# Patient Record
Sex: Female | Born: 1980 | Race: Asian | Hispanic: No | Marital: Married | Smoking: Never smoker
Health system: Southern US, Community
[De-identification: ages and names within clinical notes are randomized; demographics above are authoritative.]

## PROBLEM LIST (undated history)

## (undated) DIAGNOSIS — Z789 Other specified health status: Secondary | ICD-10-CM

## (undated) HISTORY — PX: NO PAST SURGERIES: SHX2092

---

## 2005-02-11 ENCOUNTER — Inpatient Hospital Stay: Payer: Self-pay | Admitting: Internal Medicine

## 2005-02-11 ENCOUNTER — Other Ambulatory Visit: Payer: Self-pay

## 2007-03-22 IMAGING — CT CT HEAD WITHOUT CONTRAST
2 series · 16 of 30 positions shown, 20 images · non-contrast
Comparison: none

REASON FOR EXAM: altered mental status  rm 18
COMMENTS:

[Series 2: without · axial · non-contrast · 0.37mm/px · z∈[-192,-72]mm · 13 of 29 slices shown, 17 images]
[im 3/29  brain]
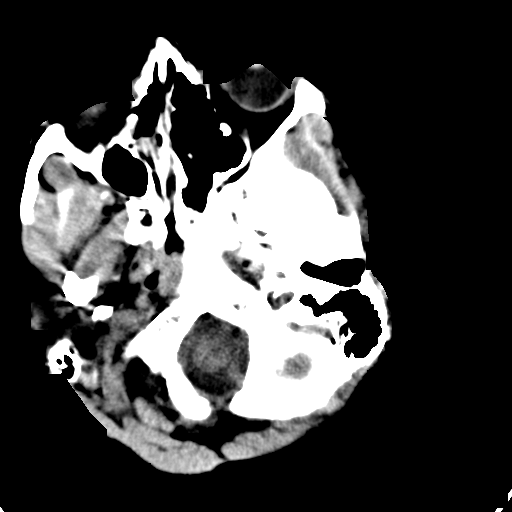
[im 3/29  bone]
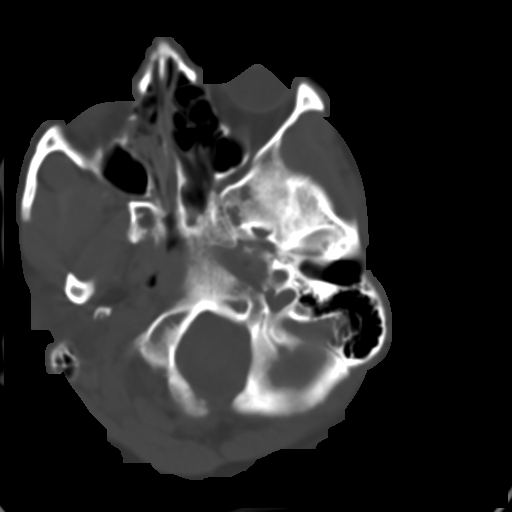
[im 5/29  brain]
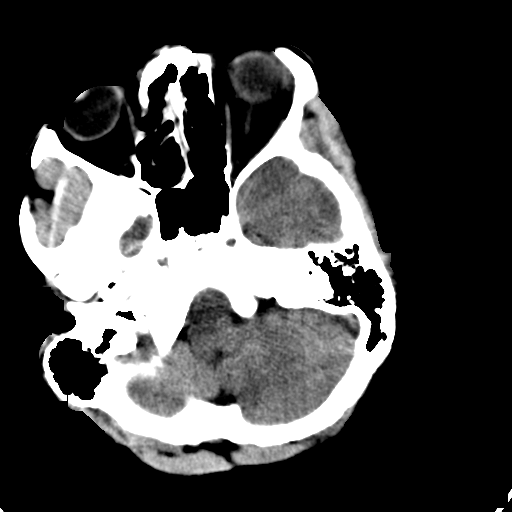
[im 7/29  brain]
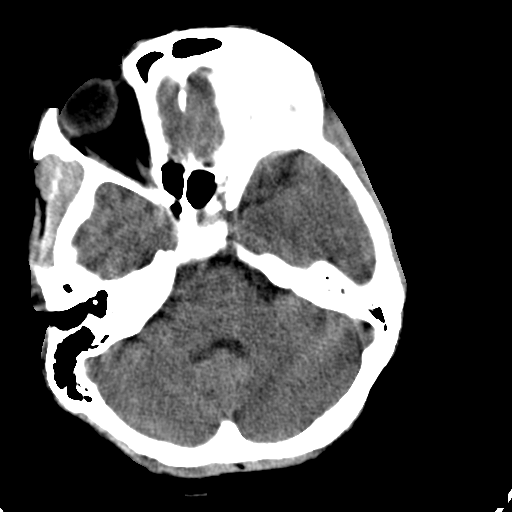
[im 9/29  brain]
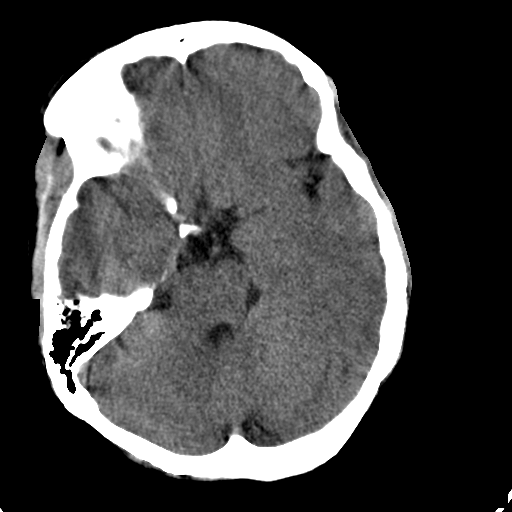
[im 11/29  brain]
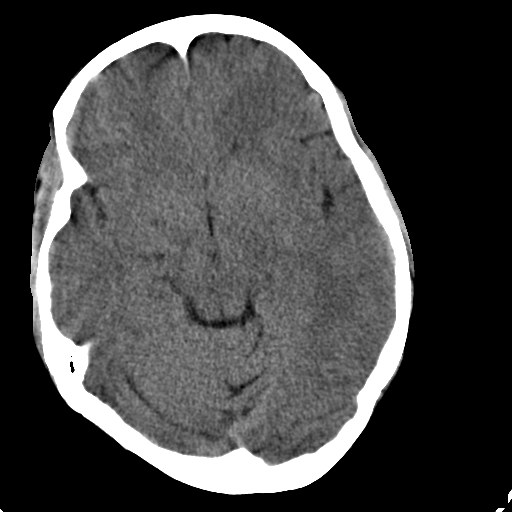
[im 11/29  bone]
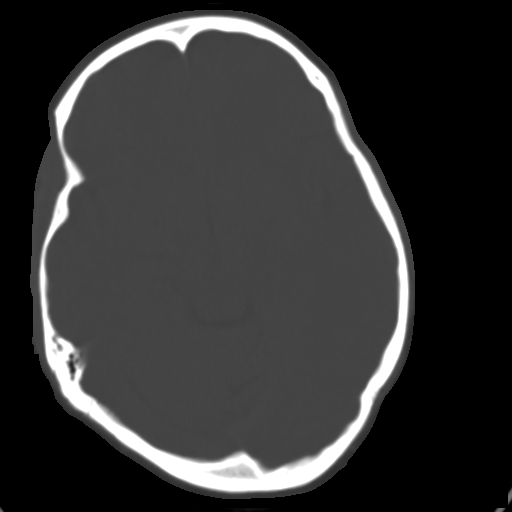
[im 13/29  brain]
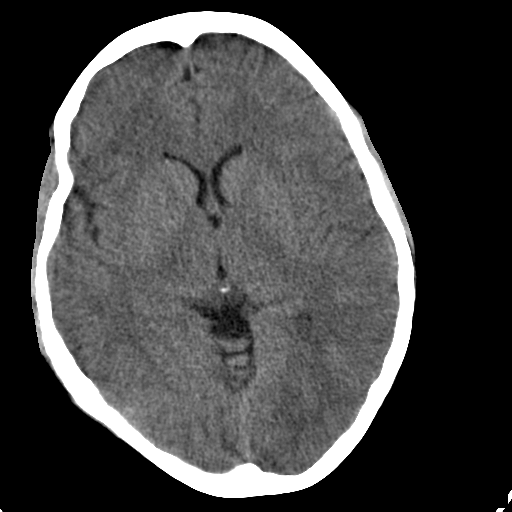
[im 15/29  brain]
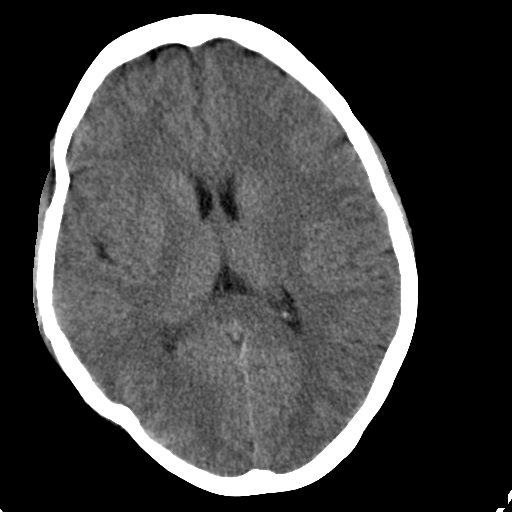
[im 17/29  brain]
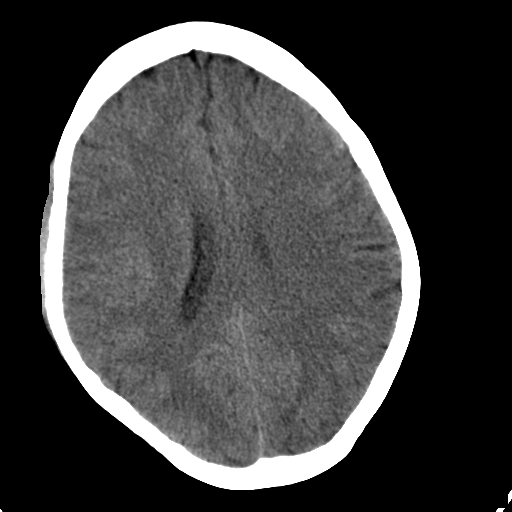
[im 19/29  brain]
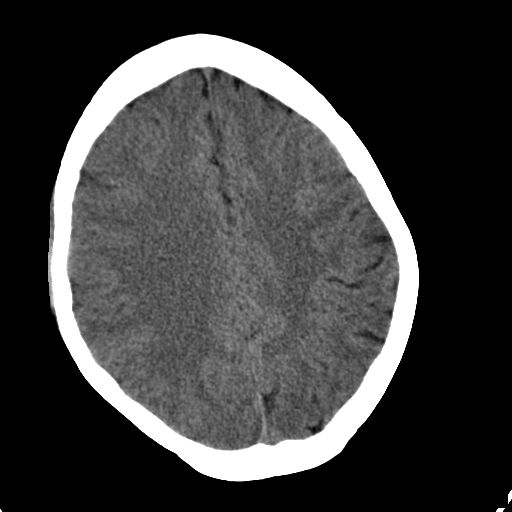
[im 19/29  bone]
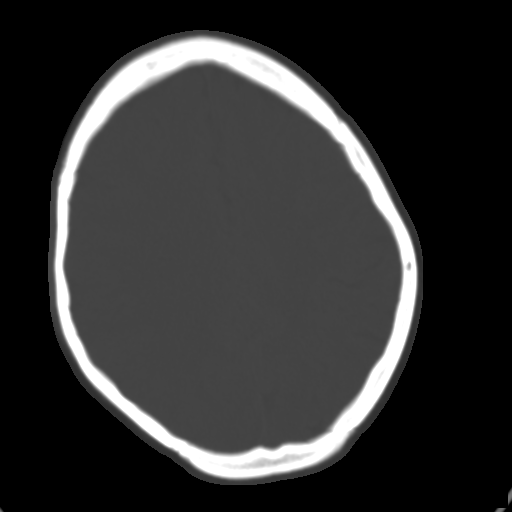
[im 21/29  brain]
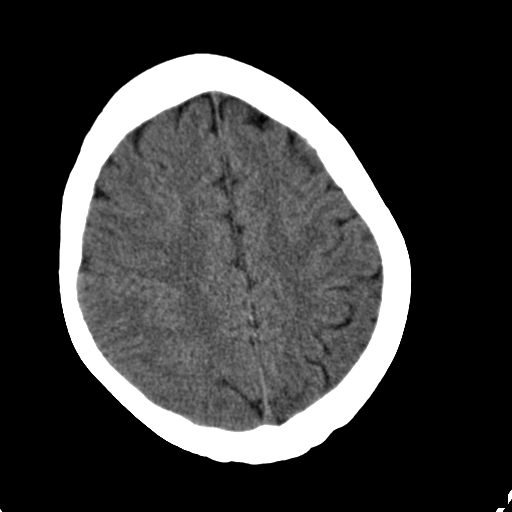
[im 23/29  brain]
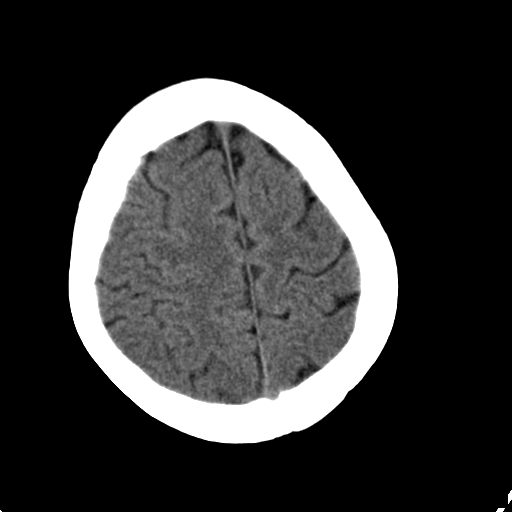
[im 25/29  brain]
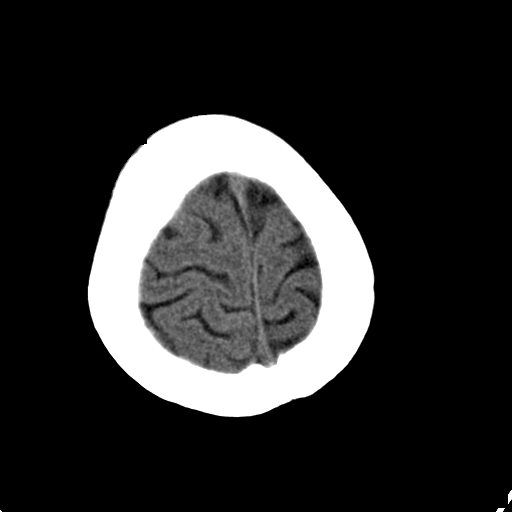
[im 27/29  brain]
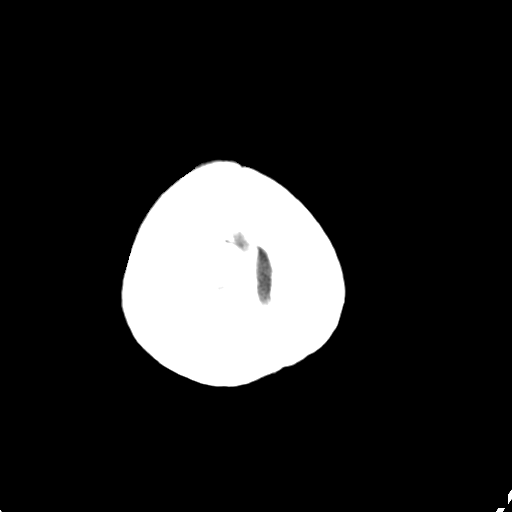
[im 27/29  bone]
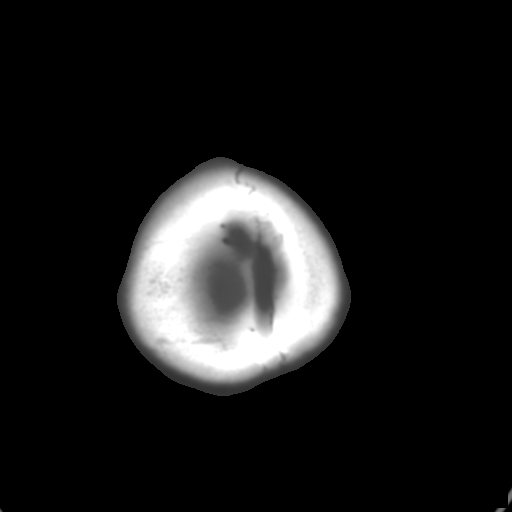

[Series 3: bone · axial · 0.37mm/px · z∈[-192,-152]mm · 3 of 29 slices shown]
[im 3/29  bone]
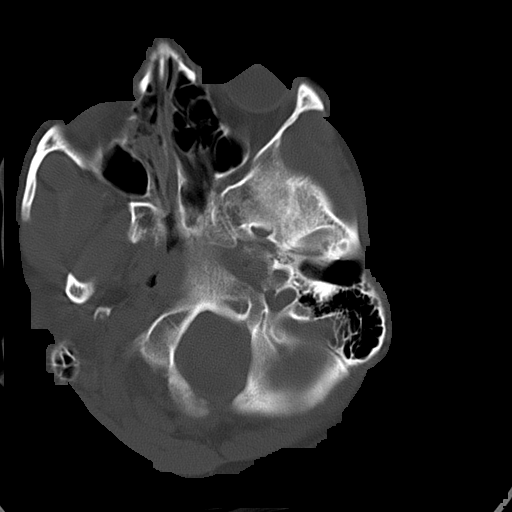
[im 7/29  bone]
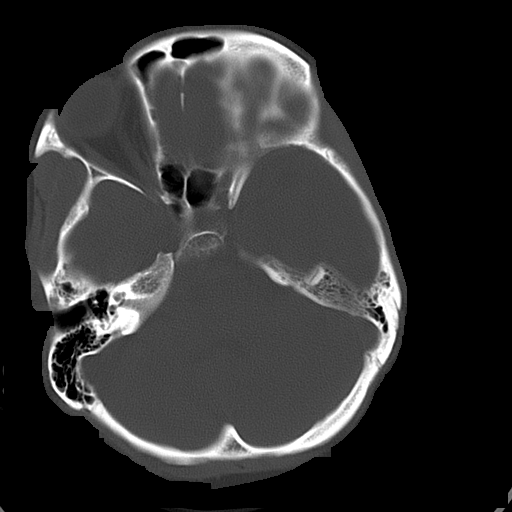
[im 11/29  bone]
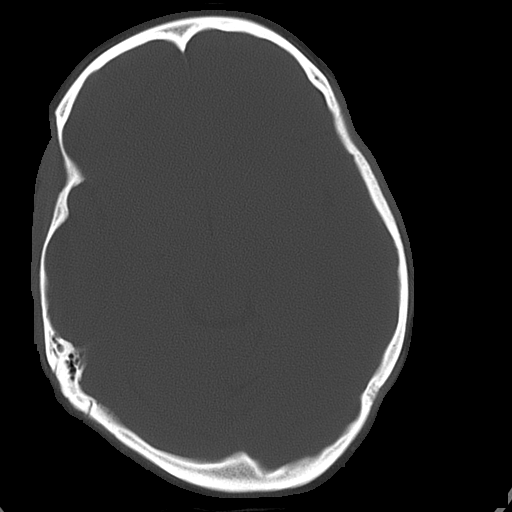

[16 of 30 positions shown; findings below may reference images not displayed]

PROCEDURE:     CT  - CT HEAD WITHOUT CONTRAST  - February 11, 2005  [DATE]

RESULT:     Emergent unenhanced Head CT was performed for altered mental
status. No bleeds and no infarcts and no mass effect is noted. No shift to
the midline. No extra-axial fluid collections are noted. On the bone window
settings no obvious bony abnormalities are seen. There appears some
opacification in the RIGHT ethmoid, which may represent a RIGHT ethmoid
sinusitis.
IMPRESSION: 1)Possible RIGHT ethmoid sinusitis, otherwise, no significant abnormalities
are seen.

The report was called to the [HOSPITAL] the dictation.

## 2011-04-16 ENCOUNTER — Inpatient Hospital Stay: Payer: Self-pay

## 2011-04-17 LAB — CBC WITH DIFFERENTIAL/PLATELET
Basophil #: 0 10*3/uL (ref 0.0–0.1)
Eosinophil #: 0.1 10*3/uL (ref 0.0–0.7)
Eosinophil %: 0.7 %
HCT: 37.8 % (ref 35.0–47.0)
Lymphocyte #: 2.3 10*3/uL (ref 1.0–3.6)
MCH: 28.6 pg (ref 26.0–34.0)
MCHC: 33.2 g/dL (ref 32.0–36.0)
MCV: 86 fL (ref 80–100)
Monocyte %: 7.1 %
Neutrophil #: 9.2 10*3/uL — ABNORMAL HIGH (ref 1.4–6.5)
Platelet: 187 10*3/uL (ref 150–440)
RDW: 15 % — ABNORMAL HIGH (ref 11.5–14.5)

## 2014-08-20 NOTE — H&P (Signed)
L&D Evaluation:  History Expanded:   HPI 34 yo G1p0 AT 1939 WEEKS WITH low efw at 9th %ile and AFI of 3.69, pt cervix at 1 cm and here for cervidil induction    Gravida 1    Term 0    PreTerm 0    Abortion 0    Living 0    Blood Type B positive    Group B Strep Results (Result >5wks must be treated as unknown) positive    Maternal HIV Negative    Maternal Syphilis Ab Nonreactive    Maternal Varicella Immune    Rubella Results immune    Maternal [facility]-Dap Immune    [facility]    Presents with Oligohydramnios    Patient's Medical History No Chronic Illness    Patient's Surgical History none    Medications Pre Natal Vitamins    Allergies NKDA    Social History none    Family History Non-Contributory    Current Prenatal Course Notable For Oligohydramnios   ROS:   ROS All systems were reviewed.  HEENT, CNS, GI, GU, Respiratory, CV, Renal and Musculoskeletal systems were found to be normal.   Exam:   Vital Signs stable    Urine Protein not completed    General no apparent distress    Mental Status clear    Chest clear    Heart normal sinus rhythm    Abdomen gravid, non-tender    Estimated Fetal Weight Small for gestational age    Fetal Position vtx    Back no CVAT    Pelvic no external lesions    Mebranes Intact    FHT normal rate with no decels    Ucx regular    Skin dry    Lymph no lymphadenopathy   Impression:   Impression Oligohydramnios   Plan:   Plan antibiotics for GBBS prophylaxis   Electronic Signatures: Adria DevonKlett, Kirstein Baxley (MD)  (Signed 04-Jan-13 23:06)  Authored: L&D Evaluation   Last Updated: 04-Jan-13 23:06 by Adria DevonKlett, Antionio Negron (MD)

## 2015-12-08 LAB — OB RESULTS CONSOLE RUBELLA ANTIBODY, IGM: Rubella: IMMUNE

## 2015-12-08 LAB — OB RESULTS CONSOLE RPR: RPR: NONREACTIVE

## 2015-12-08 LAB — OB RESULTS CONSOLE HEPATITIS B SURFACE ANTIGEN: Hepatitis B Surface Ag: NEGATIVE

## 2015-12-08 LAB — OB RESULTS CONSOLE HIV ANTIBODY (ROUTINE TESTING): HIV: NONREACTIVE

## 2015-12-08 LAB — OB RESULTS CONSOLE VARICELLA ZOSTER ANTIBODY, IGG: VARICELLA IGG: IMMUNE

## 2016-04-12 NOTE — L&D Delivery Note (Addendum)
Delivery Note At 7:00 PM a viable and healthy as yet unnamed female infant was delivered via Vaginal, Spontaneous Delivery (Presentation: OA;  ).  APGAR: pending ; weight pending  .   Placenta status: intact, .  Cord: 3vc with the following complications: none.  Cord pH: pending  Anesthesia:  epidural Episiotomy: None Lacerations:  Vaginal mucosa Suture Repair: 2.0 vicryl Est. Blood Loss (mL):  300  Mom to postpartum.  Baby to SCN.  36yo G2P1001 at 39+3wks with iol for unstable lie, inhouse for 4 days, progressed to fully dilated with pitocin. After pushing for several hours, with a break and restarting pitocin in between, she was found to be LOP, and was manually elevated and rotated to LOA. Continuous fetal monitoring during this time was reassuring. She then progressed quickly to +3 station and delivered over an intact, though swollen, perineum. He still had accels during this time.  Baby had 30sec delayed cord clamping and was placed on maternal abdomen. He was vigorous and crying with stimulation. However, he began to have trouble oxygenating and was placed on PPV and taken to SCN.   Her placenta delivered spontaneously and was intact. Fundus firm, bleeding minimal. A single stich in the edematous  Vaginal tissue was placed.   Christeen Douglas 07/27/2016, 7:16 PM

## 2016-06-30 LAB — OB RESULTS CONSOLE GC/CHLAMYDIA
Chlamydia: NEGATIVE
Gonorrhea: NEGATIVE

## 2016-06-30 LAB — OB RESULTS CONSOLE GBS: STREP GROUP B AG: NEGATIVE

## 2016-06-30 LAB — OB RESULTS CONSOLE RPR: RPR: NONREACTIVE

## 2016-07-24 ENCOUNTER — Inpatient Hospital Stay
Admission: EM | Admit: 2016-07-24 | Discharge: 2016-07-29 | DRG: 767 | Disposition: A | Discharge status: Home / Self Care | Payer: 59 | Attending: Obstetrics and Gynecology | Admitting: Obstetrics and Gynecology

## 2016-07-24 ENCOUNTER — Inpatient Hospital Stay: Payer: 59

## 2016-07-24 DIAGNOSIS — Z302 Encounter for sterilization: Secondary | ICD-10-CM | POA: Diagnosis not present

## 2016-07-24 DIAGNOSIS — O321XX0 Maternal care for breech presentation, not applicable or unspecified: Secondary | ICD-10-CM | POA: Diagnosis present

## 2016-07-24 DIAGNOSIS — O320XX0 Maternal care for unstable lie, not applicable or unspecified: Secondary | ICD-10-CM

## 2016-07-24 DIAGNOSIS — Z3A39 39 weeks gestation of pregnancy: Secondary | ICD-10-CM

## 2016-07-24 HISTORY — DX: Other specified health status: Z78.9

## 2016-07-24 LAB — TYPE AND SCREEN
ABO/RH(D): B POS
Antibody Screen: NEGATIVE

## 2016-07-24 LAB — CBC
HCT: 33.8 % — ABNORMAL LOW (ref 35.0–47.0)
Hemoglobin: 11.5 g/dL — ABNORMAL LOW (ref 12.0–16.0)
MCH: 28.4 pg (ref 26.0–34.0)
MCHC: 33.9 g/dL (ref 32.0–36.0)
MCV: 83.8 fL (ref 80.0–100.0)
PLATELETS: 226 10*3/uL (ref 150–440)
RBC: 4.03 MIL/uL (ref 3.80–5.20)
RDW: 13.8 % (ref 11.5–14.5)
WBC: 12.6 10*3/uL — AB (ref 3.6–11.0)

## 2016-07-24 MED ORDER — LIDOCAINE HCL (PF) 1 % IJ SOLN: 30.0000 mL | INTRAMUSCULAR | Status: DC | PRN

## 2016-07-24 MED ORDER — OXYTOCIN 40 UNITS IN LACTATED RINGERS INFUSION - SIMPLE MED
1.0000 m[IU]/min | INTRAVENOUS | Status: DC
  Administered 2016-07-26: 1 m[IU]/min via INTRAVENOUS
  Filled 2016-07-24: qty 1000

## 2016-07-24 MED ORDER — SOD CITRATE-CITRIC ACID 500-334 MG/5ML PO SOLN
30.0000 mL | ORAL | Status: DC | PRN
  Filled 2016-07-24: qty 30

## 2016-07-24 MED ORDER — DINOPROSTONE 10 MG VA INST
10.0000 mg | VAGINAL_INSERT | Freq: Once | VAGINAL | Status: AC
  Administered 2016-07-24: 10 mg via VAGINAL
  Filled 2016-07-24: qty 1

## 2016-07-24 MED ORDER — ONDANSETRON HCL 4 MG/2ML IJ SOLN: 4.0000 mg | Freq: Four times a day (QID) | INTRAMUSCULAR | Status: DC | PRN

## 2016-07-24 MED ORDER — LACTATED RINGERS IV SOLN
INTRAVENOUS | Status: DC
  Administered 2016-07-24 – 2016-07-27 (×5): via INTRAVENOUS

## 2016-07-24 MED ORDER — OXYTOCIN BOLUS FROM INFUSION
500.0000 mL | Freq: Once | INTRAVENOUS | Status: AC
  Administered 2016-07-27: 500 mL via INTRAVENOUS

## 2016-07-24 MED ORDER — TERBUTALINE SULFATE 1 MG/ML IJ SOLN: 0.2500 mg | Freq: Once | INTRAMUSCULAR | Status: DC | PRN

## 2016-07-24 MED ORDER — SODIUM CHLORIDE FLUSH 0.9 % IV SOLN
INTRAVENOUS | Status: AC
  Filled 2016-07-24: qty 10

## 2016-07-24 MED ORDER — BUTORPHANOL TARTRATE 1 MG/ML IJ SOLN
1.0000 mg | INTRAMUSCULAR | Status: DC | PRN
  Administered 2016-07-25 – 2016-07-26 (×3): 1 mg via INTRAVENOUS
  Filled 2016-07-24 (×3): qty 1

## 2016-07-24 MED ORDER — ACETAMINOPHEN 500 MG PO TABS: 1000.0000 mg | ORAL_TABLET | ORAL | Status: DC | PRN

## 2016-07-24 MED ORDER — LACTATED RINGERS IV SOLN
500.0000 mL | INTRAVENOUS | Status: DC | PRN
  Administered 2016-07-25: 500 mL via INTRAVENOUS

## 2016-07-24 MED ORDER — MISOPROSTOL 25 MCG QUARTER TABLET
25.0000 ug | ORAL_TABLET | ORAL | Status: DC | PRN
  Administered 2016-07-27: 25 ug via ORAL
  Filled 2016-07-24: qty 1

## 2016-07-24 MED ORDER — OXYTOCIN 40 UNITS IN LACTATED RINGERS INFUSION - SIMPLE MED
2.5000 [IU]/h | INTRAVENOUS | Status: DC
  Administered 2016-07-28: 2.5 [IU]/h via INTRAVENOUS
  Filled 2016-07-24 (×3): qty 1000

## 2016-07-24 NOTE — H&P (Signed)
OB History & Physical   History of Present Illness:  Chief Complaint:   HPI:  Shannon Keith is a 36 y.o. G2P1001 female at [redacted]w[redacted]d dated by 1st trimester ultrasound   She presents to L&D for scheduled IOL at 39wks for unstable fetal lie    +FM, no CTX, no LOF, no VB  Pregnancy Issues: 1. Unstable fetal lie - was breech in-office 5 days ago and had external cephalic version in-office    2 history of SGA infant    Maternal Medical History:  History reviewed. No pertinent past medical history.  History reviewed. No pertinent surgical history.  Not on File  Prior to Admission medications   Medication Sig Start Date End Date Taking? Authorizing Provider  Prenatal Vit-Fe Fumarate-FA (MULTIVITAMIN-PRENATAL) 27-0.8 MG TABS tablet Take 1 tablet by mouth daily at 12 noon.   Yes Historical Provider, MD     Prenatal care site: [facility]  Social History: She  reports that she has never smoked. She has never used smokeless tobacco. She reports that she does not drink alcohol or use drugs.  Family History: no history of GYN cancers  Review of Systems: A full review of systems was performed and negative except as noted in the HPI.     Physical Exam:  Vital Signs: BP 122/78 (BP Location: Left Arm)   Pulse 91   Temp 97.9 F (36.6 C) (Oral)   Resp 18   Ht  (1.575 m)   Wt 73.5 kg (162 lb)   BMI 29.63 kg/m  General: no acute distress.  HEENT: normocephalic, atraumatic Heart: regular rate & rhythm.  No murmurs/rubs/gallops Lungs: clear to auscultation bilaterally, normal respiratory effort Abdomen: soft, gravid, non-tender;  EFW: 6lbs Pelvic:   External: Normal external female genitalia  Cervix: c/l/h    Extremities: non-tender, symmetric, 1+ edema bilaterally.  DTRs: 2+ Neurologic: Alert & oriented x 3.    Results for orders placed or performed during the hospital encounter of 07/24/16 (from the past 24 hour(s))  CBC     Status: Abnormal   Collection Time:  07/24/16  8:45 PM  Result Value Ref Range   WBC 12.6 (H) 3.6 - 11.0 K/uL   RBC 4.03 3.80 - 5.20 MIL/uL   Hemoglobin 11.5 (L) 12.0 - 16.0 g/dL   HCT 16.1 (L) 09.6 - 04.5 %   MCV 83.8 80.0 - 100.0 fL   MCH 28.4 26.0 - 34.0 pg   MCHC 33.9 32.0 - 36.0 g/dL   RDW 40.9 81.1 - 91.4 %   Platelets 226 150 - 440 K/uL  Type and screen [facility]     Status: None   Collection Time: 07/24/16  8:45 PM  Result Value Ref Range   ABO/RH(D) B POS    Antibody Screen NEG    Sample Expiration 07/27/2016     Pertinent Results:  Prenatal Labs: Blood type/Rh B +  Antibody screen neg  Rubella Immune  Varicella Immune  RPR NR  HBsAg Neg  HIV NR  GC neg  Chlamydia neg  Genetic screening declines  1 hour GTT 121  3 hour GTT --  GBS neg   FHT: TOCO: SVE:   c/l/h Cephalic by ultrasound  US Ob Limited  Result Date: 07/24/2016 CLINICAL DATA:  Pre induction, confirm fetal presentation and as well as amniotic fluid EXAM: LIMITED OBSTETRIC ULTRASOUND FINDINGS: Number of Fetuses: 1 Heart Rate:  144 bpm Movement: Yes Presentation: Cephalic Placental Location: Fundal Previa: No Amniotic Fluid (  Subjective): AFI approximately 9.1 cm with single deepest pocket of 3.3 cm BPD:  9.3cm 37w  6d MATERNAL FINDINGS: Cervix:  Not identified Uterus/Adnexae: Neither ovary was visualized due to the gravid uterus. IMPRESSION: 37 week 6 day gestation in cephalic presentation with fundal placental location. No previa. AFI of approximately 9.1 cm with single deepest pocket 3.3 cm. This exam is performed on an emergent basis and does not comprehensively evaluate fetal size, dating, or anatomy; follow-up complete OB US should be considered if further fetal assessment is warranted. Electronically Signed   By: Tollie Eth M.D.   On: 07/24/2016 23:29    Assessment:  Shannon Keith is a 36 y.o. G2P1001 female at [redacted]w[redacted]d with IOL for unstable fetal lie.   Plan:  1. Admit to Labor & Delivery 2. CBC, T&S, Clrs,  IVF 3. GBS  neg 4. Consents obtained. 5. Continuous efm/toco 6. Ultrasound confirms cephalic position after version in-office  7. IOL with cervidil overnight   Will recheck after 12h and decide if continue PG induction or switch to Pitocin   ----- Ranae Plumber, MD Attending Obstetrician and Gynecologist [facility], Department of OB/GYN [facility]

## 2016-07-25 MED ORDER — AMMONIA AROMATIC IN INHA
RESPIRATORY_TRACT | Status: AC
  Filled 2016-07-25: qty 10

## 2016-07-25 MED ORDER — OXYTOCIN 10 UNIT/ML IJ SOLN
INTRAMUSCULAR | Status: AC
Start: 2016-07-25 — End: 2016-07-25
  Filled 2016-07-25: qty 2

## 2016-07-25 MED ORDER — LIDOCAINE HCL (PF) 1 % IJ SOLN
INTRAMUSCULAR | Status: AC
  Filled 2016-07-25: qty 30

## 2016-07-25 MED ORDER — DINOPROSTONE 10 MG VA INST
10.0000 mg | VAGINAL_INSERT | Freq: Once | VAGINAL | Status: AC
  Administered 2016-07-25: 10 mg via VAGINAL
  Filled 2016-07-25: qty 1

## 2016-07-25 MED ORDER — DINOPROSTONE 10 MG VA INST
10.0000 mg | VAGINAL_INSERT | Freq: Once | VAGINAL | Status: AC
  Administered 2016-07-25: 10 mg via VAGINAL
  Filled 2016-07-25 (×2): qty 1

## 2016-07-25 MED ORDER — MISOPROSTOL 200 MCG PO TABS
ORAL_TABLET | ORAL | Status: AC
  Administered 2016-07-27: 25 ug via ORAL
  Filled 2016-07-25: qty 4

## 2016-07-25 NOTE — Progress Notes (Signed)
Dr. Feliberto Gottron notified of FHR tracing with occasional variables, moderate variability with 15x15 accels.  Cervidil removed @ 2100, ctx q 2-3 min lasting 80-90 sec.  SVE 1/70/-2.  MD order to continue with plan to insert cervidil.  Orders read back and confirmed.

## 2016-07-25 NOTE — Progress Notes (Signed)
Dr. Feliberto Gottron present on the unit.  Clarification regarding next dose of cervidil.  MD states may pull current dose of cervidil, patient may eat and shower, with place next dose of cervidil after one hour.  Orders read back and confirmed.

## 2016-07-25 NOTE — Progress Notes (Signed)
Shannon Keith is a 36 y.o. G2P1001 at [redacted]w[redacted]d  Induction day 2 for unstable lie . Cervidil last PM  Feeling CTX this am  Subjective: No c/o   Objective: BP 101/64   Pulse 89   Temp 97.7 F (36.5 C) (Oral)   Resp 14   Ht  (1.575 m)   Wt 73.5 kg (162 lb)   BMI 29.63 kg/m  I/O last 3 completed shifts: In: 584.6 [P.O.:120; I.V.:464.6] Out: -  No intake/output data recorded.  FHT:  Reactive NST  UC:  q 3-5 min SVE:   Dilation: Closed Effacement (%): 50 Station: -3 Exam by:: Dr.Schermerhorn  Labs: Lab Results  Component Value Date   WBC 12.6 (H) 07/24/2016   HGB 11.5 (L) 07/24/2016   HCT 33.8 (L) 07/24/2016   MCV 83.8 07/24/2016   PLT 226 07/24/2016    Assessment / Plan: Induction for unstable lie .  Allowed to clean up and eat regular food and will replace Cervidil by nursing now and at 2400 tonight  SCHERMERHORN,THOMAS 07/25/2016, 12:18 PM

## 2016-07-26 LAB — RPR, QUANT+TP ABS (REFLEX): T Pallidum Abs: NEGATIVE

## 2016-07-26 LAB — RPR: RPR: REACTIVE — AB

## 2016-07-26 NOTE — Progress Notes (Signed)
Intrapartum progress note:  S: comfortable, feeling contractions but mild +FM no LOF no VB  O: BP 118/72 (BP Location: Left Arm)   Pulse 85   Temp 97.9 F (36.6 C) (Oral)   Resp 18   Ht  (1.575 m)   Wt 73.5 kg (162 lb)   BMI 29.63 kg/m   FHT: 125 mod +accels no decels TOCO  q12min SVE: done at 8am, 3/70/-2 cervidil inadvertetlly removed with exam (by me)  A/P: 36yo [id number]@ 39.2 with IOL for unstable fetal lie  1. Cephalic 2. IOL: s/p 3 cervidil with dilation to 3cm.  See if the in interim she continues with contractions.  She may eat a regular diet for breakfast.  After this if she stalls out we can start pitocin. 4. IUP: category 1.  ----- Ranae Plumber, MD Attending Obstetrician and Gynecologist [facility], Department of OB/GYN [place]

## 2016-07-26 NOTE — Progress Notes (Signed)
Verbal order from Ward, MD to increase Pitocin to 30 milli-unit/min max if cervix unchanged. Will continue to monitor.

## 2016-07-27 ENCOUNTER — Encounter: Payer: Self-pay | Admitting: *Deleted

## 2016-07-27 ENCOUNTER — Inpatient Hospital Stay: Payer: 59 | Admitting: Registered Nurse

## 2016-07-27 MED ORDER — DIBUCAINE 1 % RE OINT: 1.0000 "application " | TOPICAL_OINTMENT | RECTAL | Status: DC | PRN

## 2016-07-27 MED ORDER — LIDOCAINE-EPINEPHRINE (PF) 1.5 %-1:200000 IJ SOLN
INTRAMUSCULAR | Status: DC | PRN
  Administered 2016-07-27: 3 mL via PERINEURAL

## 2016-07-27 MED ORDER — BUPIVACAINE HCL (PF) 0.25 % IJ SOLN
INTRAMUSCULAR | Status: DC | PRN
  Administered 2016-07-27 (×2): 4 mL via EPIDURAL

## 2016-07-27 MED ORDER — EPHEDRINE 5 MG/ML INJ
10.0000 mg | INTRAVENOUS | Status: DC | PRN
  Filled 2016-07-27: qty 2

## 2016-07-27 MED ORDER — FENTANYL 2.5 MCG/ML W/ROPIVACAINE 0.2% IN NS 100 ML EPIDURAL INFUSION (ARMC-ANES)
EPIDURAL | Status: DC | PRN
  Administered 2016-07-27: 10 mL/h via EPIDURAL

## 2016-07-27 MED ORDER — FAMOTIDINE 20 MG PO TABS
40.0000 mg | ORAL_TABLET | Freq: Once | ORAL | Status: AC
  Administered 2016-07-28: 40 mg via ORAL
  Filled 2016-07-27: qty 2

## 2016-07-27 MED ORDER — SODIUM CHLORIDE 0.9 % IV SOLN: 250.0000 mL | INTRAVENOUS | Status: DC | PRN

## 2016-07-27 MED ORDER — MEASLES, MUMPS & RUBELLA VAC ~~LOC~~ INJ
0.5000 mL | INJECTION | Freq: Once | SUBCUTANEOUS | Status: DC
  Filled 2016-07-27: qty 0.5

## 2016-07-27 MED ORDER — FENTANYL 2.5 MCG/ML W/ROPIVACAINE 0.2% IN NS 100 ML EPIDURAL INFUSION (ARMC-ANES): 10.0000 mL/h | EPIDURAL | Status: DC

## 2016-07-27 MED ORDER — WITCH HAZEL-GLYCERIN EX PADS
1.0000 | MEDICATED_PAD | CUTANEOUS | Status: DC | PRN
Start: 2016-07-27 — End: 2016-07-29

## 2016-07-27 MED ORDER — LACTATED RINGERS IV SOLN: 500.0000 mL | Freq: Once | INTRAVENOUS | Status: DC

## 2016-07-27 MED ORDER — LACTATED RINGERS IV SOLN
INTRAVENOUS | Status: DC
Start: 2016-07-27 — End: 2016-07-28

## 2016-07-27 MED ORDER — FLEET ENEMA 7-19 GM/118ML RE ENEM: 1.0000 | ENEMA | Freq: Every day | RECTAL | Status: DC | PRN

## 2016-07-27 MED ORDER — BISACODYL 10 MG RE SUPP: 10.0000 mg | Freq: Every day | RECTAL | Status: DC | PRN

## 2016-07-27 MED ORDER — COCONUT OIL OIL: 1.0000 "application " | TOPICAL_OIL | Status: DC | PRN

## 2016-07-27 MED ORDER — DIPHENHYDRAMINE HCL 25 MG PO CAPS: 25.0000 mg | ORAL_CAPSULE | Freq: Four times a day (QID) | ORAL | Status: DC | PRN

## 2016-07-27 MED ORDER — ACETAMINOPHEN 325 MG PO TABS
650.0000 mg | ORAL_TABLET | ORAL | Status: DC | PRN
  Administered 2016-07-28: 650 mg via ORAL
  Filled 2016-07-27: qty 2

## 2016-07-27 MED ORDER — LIDOCAINE HCL (PF) 1 % IJ SOLN
INTRAMUSCULAR | Status: DC | PRN
  Administered 2016-07-27: 3 mL

## 2016-07-27 MED ORDER — BENZOCAINE-MENTHOL 20-0.5 % EX AERO
1.0000 | INHALATION_SPRAY | CUTANEOUS | Status: DC | PRN
Start: 2016-07-27 — End: 2016-07-29
  Administered 2016-07-27: 1 via TOPICAL
  Filled 2016-07-27: qty 56

## 2016-07-27 MED ORDER — SODIUM CHLORIDE 0.9% FLUSH: 3.0000 mL | INTRAVENOUS | Status: DC | PRN

## 2016-07-27 MED ORDER — PHENYLEPHRINE 40 MCG/ML (10ML) SYRINGE FOR IV PUSH (FOR BLOOD PRESSURE SUPPORT)
80.0000 ug | PREFILLED_SYRINGE | INTRAVENOUS | Status: DC | PRN
  Filled 2016-07-27: qty 5

## 2016-07-27 MED ORDER — CARBOPROST TROMETHAMINE 250 MCG/ML IM SOLN
INTRAMUSCULAR | Status: AC
  Filled 2016-07-27: qty 1

## 2016-07-27 MED ORDER — METOCLOPRAMIDE HCL 10 MG PO TABS
10.0000 mg | ORAL_TABLET | Freq: Once | ORAL | Status: AC
  Administered 2016-07-27: 10 mg via ORAL
  Filled 2016-07-27: qty 1

## 2016-07-27 MED ORDER — SODIUM CHLORIDE 0.9% FLUSH: 3.0000 mL | Freq: Two times a day (BID) | INTRAVENOUS | Status: DC

## 2016-07-27 MED ORDER — ONDANSETRON HCL 4 MG/2ML IJ SOLN: 4.0000 mg | INTRAMUSCULAR | Status: DC | PRN

## 2016-07-27 MED ORDER — SENNOSIDES-DOCUSATE SODIUM 8.6-50 MG PO TABS
2.0000 | ORAL_TABLET | ORAL | Status: DC
  Administered 2016-07-27 – 2016-07-28 (×2): 2 via ORAL
  Filled 2016-07-27 (×2): qty 2

## 2016-07-27 MED ORDER — FENTANYL 2.5 MCG/ML W/ROPIVACAINE 0.2% IN NS 100 ML EPIDURAL INFUSION (ARMC-ANES)
EPIDURAL | Status: AC
  Filled 2016-07-27: qty 100

## 2016-07-27 MED ORDER — SIMETHICONE 80 MG PO CHEW: 80.0000 mg | CHEWABLE_TABLET | ORAL | Status: DC | PRN

## 2016-07-27 MED ORDER — SODIUM CHLORIDE 0.9 % IV SOLN
2.0000 g | Freq: Once | INTRAVENOUS | Status: AC
  Administered 2016-07-27: 2 g via INTRAVENOUS
  Filled 2016-07-27: qty 2000

## 2016-07-27 MED ORDER — DIPHENHYDRAMINE HCL 50 MG/ML IJ SOLN: 12.5000 mg | INTRAMUSCULAR | Status: DC | PRN

## 2016-07-27 MED ORDER — TETANUS-DIPHTH-ACELL PERTUSSIS 5-2.5-18.5 LF-MCG/0.5 IM SUSP: 0.5000 mL | Freq: Once | INTRAMUSCULAR | Status: DC

## 2016-07-27 MED ORDER — ZOLPIDEM TARTRATE 5 MG PO TABS: 5.0000 mg | ORAL_TABLET | Freq: Every evening | ORAL | Status: DC | PRN

## 2016-07-27 MED ORDER — IBUPROFEN 600 MG PO TABS
600.0000 mg | ORAL_TABLET | Freq: Four times a day (QID) | ORAL | Status: DC
  Administered 2016-07-27 – 2016-07-29 (×5): 600 mg via ORAL
  Filled 2016-07-27 (×5): qty 1

## 2016-07-27 MED ORDER — ONDANSETRON HCL 4 MG PO TABS: 4.0000 mg | ORAL_TABLET | ORAL | Status: DC | PRN

## 2016-07-27 MED ORDER — PRENATAL MULTIVITAMIN CH
1.0000 | ORAL_TABLET | Freq: Every day | ORAL | Status: DC
  Administered 2016-07-28 – 2016-07-29 (×2): 1 via ORAL
  Filled 2016-07-27 (×2): qty 1

## 2016-07-27 NOTE — Discharge Summary (Signed)
Obstetrical Discharge Summary  Patient Name: Shannon Keith DOB: [birth year] MRN: [id number]  Date of Admission: 07/24/2016 Date of Discharge: 07/29/2016  Primary OB: [facility]  Gestational Age at Delivery: [redacted]w[redacted]d   Antepartum complications:  AMA  Hx. Of IUGR vs SGA: (no records available and pt. Unsure) Baby weighed 5#11oz.   Weekly NST/AFI at 32 weeks  Monthly growth [place]   Hx. Of Oligohydramnios with IOL at 39 weeks: Weekly AFI starting at 32 weeks   Transfer from [facility] at 18 weeks  Admitting Diagnosis: unstable lie Secondary Diagnosis: Patient Active Problem List   Diagnosis Date Noted  . Labor and delivery, indication for care 07/24/2016    Augmentation: AROM, Pitocin, Cytotec and Cervidil Complications: None Intrapartum complications/course: 36yo G2P1001 at 39+3wks with iol for unstable lie, inhouse for 4 days, progressed to fully dilated with pitocin. After pushing for several hours, with a break and restarting pitocin in between, she was found to be LOP, and was manually elevated and rotated to LOA. She then progressed quickly to +3 station and delivered over an intact, though swollen, perineum.  Baby had 30sec delayed cord clamping and was placed on maternal abdomen. He was vigorous and crying with stimulation. However, he began to have trouble oxygenating and was placed on PPV and taken to SCN.   Her placenta delivered spontaneously and was intact. Fundus firm, bleeding minimal. A single stich in the edematous  Vaginal tissue was placed.  Date of Delivery: 07/27/16 Delivered By: Christeen Douglas, MD Delivery Type: spontaneous vaginal delivery Anesthesia: epidural Placenta: Spontaneous Laceration: vaginal mucosa, superficial Episiotomy: none Newborn Data: Live born female  Birth Weight:   APGAR: ,     Discharge Physical Exam:  BP 102/65 (BP Location: Right Arm)   Pulse 73   Temp 98.5 F (36.9 C) (Oral)   Resp 18   Ht  (1.575 m)   Wt 162 lb  (73.5 kg)   SpO2 100%   Breastfeeding? Unknown   BMI 29.63 kg/m   General: NAD CV: RRR Pulm: CTABL, nl effort ABD: s/nd/nt, fundus firm and below the umbilicus Lochia: moderate Incision: c/d/i  DVT Evaluation: LE non-ttp, no evidence of DVT on exam.  Hemoglobin  Date Value Ref Range Status  07/28/2016 11.1 (L) 12.0 - 16.0 g/dL Final   HGB  Date Value Ref Range Status  04/17/2011 12.6 12.0 - 16.0 g/dL Final   HCT  Date Value Ref Range Status  07/28/2016 33.2 (L) 35.0 - 47.0 % Final  04/19/2011 28.0 (L) 35.0 - 47.0 % Final    Post partum course: uncomplicated Postpartum Procedures: postpartum BTL Disposition: stable, discharge to home. Baby Feeding: breastmilk Baby Disposition: undecided  Contraception: BTL  Prenatal Labs:   MBT B+ Ab screen Negative Pap 12/09/15 Neg HIV Neg Hep B/RPR Neg/Non reactive   Plan:  Mane Consolo was discharged to home in good condition. Repeat WBC prior to d/c. No other evidence infection. Follow-up appointment at [facility] with delivering provider in 6 weeks   Discharge Medications: Allergies as of 07/29/2016   No Known Allergies     Medication List    TAKE these medications   ascorbic acid 250 MG tablet Commonly known as:  VITAMIN C Take 1 tablet (250 mg total) by mouth 2 (two) times daily with a meal. Take with iron for anemia   docusate sodium 100 MG capsule Commonly known as:  COLACE Take 1 capsule (100 mg total) by mouth daily as needed for mild constipation.  ferrous sulfate 325 (65 FE) MG tablet Take 1 tablet (325 mg total) by mouth 2 (two) times daily with a meal. For anemia, take with Vitamin C   ibuprofen 800 MG tablet Commonly known as:  ADVIL,MOTRIN Take 1 tablet (800 mg total) by mouth every 8 (eight) hours as needed for moderate pain or cramping.   multivitamin-prenatal 27-0.8 MG Tabs tablet Take 1 tablet by mouth daily at 12 noon.   ondansetron 4 MG disintegrating tablet Commonly known as:   ZOFRAN ODT Take 1 tablet (4 mg total) by mouth every 8 (eight) hours as needed for nausea or vomiting.   oxyCODONE-acetaminophen 5-325 MG tablet Commonly known as:  PERCOCET Take 1-2 tablets by mouth every 6 (six) hours as needed for severe pain.         Signed: Christeen Douglas 07/29/16

## 2016-07-27 NOTE — Progress Notes (Signed)
Patient receiving epidural from 7:15 to 7:50.  U/S tracing maternal heart rate during placement.

## 2016-07-27 NOTE — Progress Notes (Signed)
Intrapartum progress note:  S: patient resting comfortably , not really feeling ctx No LOF VB +FM   O: BP 108/63 (BP Location: Right Arm)   Pulse 78   Temp 97.8 F (36.6 C) (Oral)   Resp 19   Ht  (1.575 m)   Wt 73.5 kg (162 lb)   BMI 29.63 kg/m   FHT: 125 mod + accels no decels Toco: irregular SVE: 4/70/-2 AROM for clear fluid  A/P: 36yo G2P1001 @ 39.3 with IOL for unstable fetal lie  1. IUP: category 1 2. IOL: s/p cervidil x3, cytotec x 1 and then pitocin, given pit rest and additional cytotec as she was contracting well with cytotec prior.  That is not the case this time, will restart pitocin after AROM. 3. Continue active management  ----- Ranae Plumber, MD Attending Obstetrician and Gynecologist [facility], Department of OB/GYN [facility]

## 2016-07-27 NOTE — Progress Notes (Signed)
Patient ID: Shannon Keith, female   DOB: Apr 25, 1980, 36 y.o.   MRN: [id number] Call from nurse with decels  Strip reviewed: Cat II with deep variables and now recurrent late decels. Conservative measures started, pitocin held. Will discuss plan and progress with patient.

## 2016-07-27 NOTE — Anesthesia Procedure Notes (Signed)
Epidural Patient location during procedure: OB Start time: 07/27/2016 7:38 AM End time: 07/27/2016 7:43 AM  Staffing Anesthesiologist: Priscella Mann AMY Resident/CRNA: Stormy Fabian Performed: resident/CRNA   Preanesthetic Checklist Completed: patient identified, site marked, surgical consent, pre-op evaluation, timeout performed, IV checked, risks and benefits discussed and monitors and equipment checked  Epidural Patient position: sitting Prep: Betadine Patient monitoring: heart rate, continuous pulse ox and blood pressure Approach: midline Location: L3-L4 Injection technique: LOR saline  Needle:  Needle type: Tuohy  Needle gauge: 17 G Needle length: 9 cm and 9 Needle insertion depth: 5.5 cm Catheter type: closed end flexible Catheter size: 19 Gauge Catheter at skin depth: 10 cm Test dose: negative and 1.5% lidocaine with Epi 1:200 K  Assessment Sensory level: T10 Events: blood not aspirated, injection not painful, no injection resistance, negative IV test and no paresthesia  Additional Notes Pt. Evaluated and documentation done after procedure finished. Patient identified. Risks/Benefits/Options discussed with patient including but not limited to bleeding, infection, nerve damage, paralysis, failed block, incomplete pain control, headache, blood pressure changes, nausea, vomiting, reactions to medication both or allergic, itching and postpartum back pain. Confirmed with bedside nurse the patient's most recent platelet count. Confirmed with patient that they are not currently taking any anticoagulation, have any bleeding history or any family history of bleeding disorders. Patient expressed understanding and wished to proceed. All questions were answered. Sterile technique was used throughout the entire procedure. Please see nursing notes for vital signs. Test dose was given through epidural catheter and negative prior to continuing to dose epidural or start infusion. Warning signs  of high block given to the patient including shortness of breath, tingling/numbness in hands, complete motor block, or any concerning symptoms with instructions to call for help. Patient was given instructions on fall risk and not to get out of bed. All questions and concerns addressed with instructions to call with any issues or inadequate analgesia.   Patient tolerated the insertion well without immediate complications.Reason for block:procedure for pain

## 2016-07-27 NOTE — Progress Notes (Signed)
Verbal order from Dalbert Garnet, MD to increase Pitocin to 30 milli-unit/min max if cervix unchanged.

## 2016-07-27 NOTE — Progress Notes (Signed)
Epidural placed - U/S tracing fetal heart rate.  RN at bedside.

## 2016-07-27 NOTE — Anesthesia Preprocedure Evaluation (Signed)
Anesthesia Evaluation  Patient identified by MRN, date of birth, ID band Patient awake    Reviewed: Allergy & Precautions, H&P , NPO status , Patient's Chart, lab work & pertinent test results  Airway Mallampati: II  TM Distance: >3 FB Neck ROM: full    Dental no notable dental hx.    Pulmonary neg pulmonary ROS,           Cardiovascular negative cardio ROS Normal cardiovascular exam     Neuro/Psych negative neurological ROS  negative psych ROS   GI/Hepatic negative GI ROS, Neg liver ROS,   Endo/Other  negative endocrine ROS  Renal/GU negative Renal ROS  negative genitourinary   Musculoskeletal   Abdominal   Peds  Hematology negative hematology ROS (+)   Anesthesia Other Findings   Reproductive/Obstetrics (+) Pregnancy                             Anesthesia Physical Anesthesia Plan  ASA: II  Anesthesia Plan: Epidural   Post-op Pain Management:    Induction:   Airway Management Planned:   Additional Equipment:   Intra-op Plan:   Post-operative Plan:   Informed Consent: I have reviewed the patients History and Physical, chart, labs and discussed the procedure including the risks, benefits and alternatives for the proposed anesthesia with the patient or authorized representative who has indicated his/her understanding and acceptance.     Plan Discussed with: Anesthesiologist  Anesthesia Plan Comments:         Anesthesia Quick Evaluation

## 2016-07-27 NOTE — Progress Notes (Signed)
Patient ID: Shannon Keith, female   DOB: Aug 02, 1980, 36 y.o.   MRN: [id number] Late entry due to patient care  Stopping pitocin help baby recover, and she returned to mod var, +accels without decels.  She is now completely dilated at 1+. Will try pushing, and labor down if needed.

## 2016-07-27 NOTE — Progress Notes (Signed)
Shannon Keith is a 36 y.o. G2P1001 at [redacted]w[redacted]d with iol for unstable lie. AROM at 0100 for clear fluid. Pitocin on 26mu/min  Subjective: s/p epidural, comfortable  Objective: BP (!) 102/57   Pulse 92   Temp 97.9 F (36.6 C) (Oral)   Resp 18   Ht  (1.575 m)   Wt 162 lb (73.5 kg)   BMI 29.63 kg/m  I/O last 3 completed shifts: In: 3188.7 [P.O.:120; I.V.:3068.7] Out: -  No intake/output data recorded.  FHT:  FHR: 125 bpm, variability: moderate,  accelerations:  Present,  decelerations:  Absent UC:   regular, every 4 minutes SVE:   Dilation: 4.5 Effacement (%): 70 Station: -2 Exam by:: Dr. Dalbert Garnet (IUPC placed)  Labs: Lab Results  Component Value Date   WBC 12.6 (H) 07/24/2016   HGB 11.5 (L) 07/24/2016   HCT 33.8 (L) 07/24/2016   MCV 83.8 07/24/2016   PLT 226 07/24/2016    Assessment / Plan:  IUPC placed. Continue active management. Cervix unchanged for 6 hours at this point, but Cat I strip, no evidence of infection, and mom would like to continue laboring. Will continue.  Christeen Douglas 07/27/2016, 9:23 AM

## 2016-07-28 ENCOUNTER — Inpatient Hospital Stay: Payer: 59 | Admitting: Anesthesiology

## 2016-07-28 ENCOUNTER — Encounter: Payer: Self-pay | Admitting: Certified Registered Nurse Anesthetist

## 2016-07-28 ENCOUNTER — Encounter
Admission: EM | Disposition: A | Discharge status: Home / Self Care | Payer: Self-pay | Source: Home / Self Care | Attending: Obstetrics & Gynecology

## 2016-07-28 HISTORY — PX: TUBAL LIGATION: SHX77

## 2016-07-28 LAB — CBC
HCT: 33.2 % — ABNORMAL LOW (ref 35.0–47.0)
Hemoglobin: 11.1 g/dL — ABNORMAL LOW (ref 12.0–16.0)
MCH: 28.6 pg (ref 26.0–34.0)
MCHC: 33.4 g/dL (ref 32.0–36.0)
MCV: 85.6 fL (ref 80.0–100.0)
PLATELETS: 190 10*3/uL (ref 150–440)
RBC: 3.88 MIL/uL (ref 3.80–5.20)
RDW: 14.3 % (ref 11.5–14.5)
WBC: 17.9 10*3/uL — AB (ref 3.6–11.0)

## 2016-07-28 SURGERY — LIGATION, FALLOPIAN TUBE, POSTPARTUM
Anesthesia: General | Wound class: Clean Contaminated

## 2016-07-28 MED ORDER — ACETAMINOPHEN 10 MG/ML IV SOLN
INTRAVENOUS | Status: DC | PRN
  Administered 2016-07-28: 1000 mg via INTRAVENOUS

## 2016-07-28 MED ORDER — KETOROLAC TROMETHAMINE 30 MG/ML IJ SOLN
INTRAMUSCULAR | Status: AC
  Filled 2016-07-28: qty 1

## 2016-07-28 MED ORDER — DEXAMETHASONE SODIUM PHOSPHATE 10 MG/ML IJ SOLN
INTRAMUSCULAR | Status: AC
  Filled 2016-07-28: qty 1

## 2016-07-28 MED ORDER — LIDOCAINE HCL (CARDIAC) 20 MG/ML IV SOLN
INTRAVENOUS | Status: DC | PRN
  Administered 2016-07-28: 80 mg via INTRAVENOUS

## 2016-07-28 MED ORDER — BUPIVACAINE HCL (PF) 0.5 % IJ SOLN
INTRAMUSCULAR | Status: AC
  Filled 2016-07-28: qty 30

## 2016-07-28 MED ORDER — DEXAMETHASONE SODIUM PHOSPHATE 10 MG/ML IJ SOLN
INTRAMUSCULAR | Status: DC | PRN
  Administered 2016-07-28: 10 mg via INTRAVENOUS

## 2016-07-28 MED ORDER — ONDANSETRON HCL 4 MG/2ML IJ SOLN
INTRAMUSCULAR | Status: DC | PRN
  Administered 2016-07-28: 4 mg via INTRAVENOUS

## 2016-07-28 MED ORDER — BREAST MILK
ORAL | Status: DC
  Filled 2016-07-28: qty 1

## 2016-07-28 MED ORDER — SEVOFLURANE IN SOLN
RESPIRATORY_TRACT | Status: AC
  Filled 2016-07-28: qty 250

## 2016-07-28 MED ORDER — BUPIVACAINE HCL (PF) 0.5 % IJ SOLN
INTRAMUSCULAR | Status: DC | PRN
  Administered 2016-07-28: 2 mL

## 2016-07-28 MED ORDER — HYDROCODONE-ACETAMINOPHEN 5-325 MG PO TABS: 2.0000 | ORAL_TABLET | ORAL | Status: DC | PRN

## 2016-07-28 MED ORDER — FENTANYL CITRATE (PF) 100 MCG/2ML IJ SOLN
INTRAMUSCULAR | Status: AC
  Filled 2016-07-28: qty 2

## 2016-07-28 MED ORDER — PROPOFOL 10 MG/ML IV BOLUS
INTRAVENOUS | Status: AC
  Filled 2016-07-28: qty 20

## 2016-07-28 MED ORDER — PHENYLEPHRINE HCL 10 MG/ML IJ SOLN
INTRAMUSCULAR | Status: DC | PRN
  Administered 2016-07-28: 100 ug via INTRAVENOUS

## 2016-07-28 MED ORDER — PROPOFOL 10 MG/ML IV BOLUS
INTRAVENOUS | Status: DC | PRN
  Administered 2016-07-28: 200 mg via INTRAVENOUS

## 2016-07-28 MED ORDER — OXYCODONE HCL 5 MG PO TABS: 5.0000 mg | ORAL_TABLET | Freq: Once | ORAL | Status: DC | PRN

## 2016-07-28 MED ORDER — OXYCODONE HCL 5 MG/5ML PO SOLN: 5.0000 mg | Freq: Once | ORAL | Status: DC | PRN

## 2016-07-28 MED ORDER — ONDANSETRON HCL 4 MG/2ML IJ SOLN
INTRAMUSCULAR | Status: AC
  Filled 2016-07-28: qty 2

## 2016-07-28 MED ORDER — SUCCINYLCHOLINE CHLORIDE 20 MG/ML IJ SOLN
INTRAMUSCULAR | Status: DC | PRN
  Administered 2016-07-28: 100 mg via INTRAVENOUS

## 2016-07-28 MED ORDER — FENTANYL CITRATE (PF) 100 MCG/2ML IJ SOLN
INTRAMUSCULAR | Status: DC | PRN
  Administered 2016-07-28 (×2): 50 ug via INTRAVENOUS

## 2016-07-28 MED ORDER — ACETAMINOPHEN 10 MG/ML IV SOLN
INTRAVENOUS | Status: AC
  Filled 2016-07-28: qty 100

## 2016-07-28 MED ORDER — KETOROLAC TROMETHAMINE 30 MG/ML IJ SOLN
INTRAMUSCULAR | Status: DC | PRN
  Administered 2016-07-28: 30 mg via INTRAVENOUS

## 2016-07-28 MED ORDER — SUCCINYLCHOLINE CHLORIDE 20 MG/ML IJ SOLN
INTRAMUSCULAR | Status: AC
  Filled 2016-07-28: qty 1

## 2016-07-28 MED ORDER — FENTANYL CITRATE (PF) 100 MCG/2ML IJ SOLN
25.0000 ug | INTRAMUSCULAR | Status: DC | PRN
Start: 2016-07-28 — End: 2016-07-28

## 2016-07-28 MED ORDER — LACTATED RINGERS IV SOLN
INTRAVENOUS | Status: DC
  Administered 2016-07-28: 10:00:00 via INTRAVENOUS

## 2016-07-28 MED ORDER — LIDOCAINE HCL (PF) 2 % IJ SOLN
INTRAMUSCULAR | Status: AC
  Filled 2016-07-28: qty 2

## 2016-07-28 SURGICAL SUPPLY — 33 items
APPLICATOR COTTON TIP 6IN STRL (MISCELLANEOUS) ×3 IMPLANT
BLADE SURG SZ11 CARB STEEL (BLADE) ×3 IMPLANT
CANISTER SUCT 1200ML W/VALVE (MISCELLANEOUS) ×3 IMPLANT
CHLORAPREP W/TINT 26ML (MISCELLANEOUS) ×3 IMPLANT
CLOSURE WOUND 1/4X4 (GAUZE/BANDAGES/DRESSINGS) ×1
DECANTER SPIKE VIAL GLASS SM (MISCELLANEOUS) ×3 IMPLANT
DERMABOND ADVANCED (GAUZE/BANDAGES/DRESSINGS)
DERMABOND ADVANCED .7 DNX12 (GAUZE/BANDAGES/DRESSINGS) IMPLANT
DRAPE LAPAROTOMY 100X77 ABD (DRAPES) ×3 IMPLANT
DRSG TEGADERM 2-3/8X2-3/4 SM (GAUZE/BANDAGES/DRESSINGS) ×3 IMPLANT
DRSG TEGADERM 4X4.75 (GAUZE/BANDAGES/DRESSINGS) ×3 IMPLANT
ELECT CAUTERY BLADE 6.4 (BLADE) ×3 IMPLANT
ELECT REM PT RETURN 9FT ADLT (ELECTROSURGICAL) ×3
ELECTRODE REM PT RTRN 9FT ADLT (ELECTROSURGICAL) ×1 IMPLANT
GAUZE SPONGE NON-WVN 2X2 STRL (MISCELLANEOUS) ×1 IMPLANT
GLOVE BIO SURGEON STRL SZ8 (GLOVE) ×3 IMPLANT
GOWN STRL REUS W/ TWL LRG LVL3 (GOWN DISPOSABLE) ×1 IMPLANT
GOWN STRL REUS W/ TWL XL LVL3 (GOWN DISPOSABLE) ×1 IMPLANT
GOWN STRL REUS W/TWL LRG LVL3 (GOWN DISPOSABLE) ×2
GOWN STRL REUS W/TWL XL LVL3 (GOWN DISPOSABLE) ×2
KIT RM TURNOVER STRD PROC AR (KITS) ×3 IMPLANT
LABEL OR SOLS (LABEL) ×3 IMPLANT
NEEDLE HYPO 25X1 1.5 SAFETY (NEEDLE) ×3 IMPLANT
NS IRRIG 500ML POUR BTL (IV SOLUTION) ×3 IMPLANT
PACK BASIN MINOR ARMC (MISCELLANEOUS) ×3 IMPLANT
SPONGE VERSALON 2X2 STRL (MISCELLANEOUS) ×2
STRIP CLOSURE SKIN 1/4X4 (GAUZE/BANDAGES/DRESSINGS) ×2 IMPLANT
SUT PLAIN GUT 0 (SUTURE) ×6 IMPLANT
SUT VIC AB 2-0 UR6 27 (SUTURE) ×3 IMPLANT
SUT VIC AB 4-0 SH 27 (SUTURE) ×2
SUT VIC AB 4-0 SH 27XANBCTRL (SUTURE) ×1 IMPLANT
SWABSTK COMLB BENZOIN TINCTURE (MISCELLANEOUS) ×3 IMPLANT
SYRINGE 10CC LL (SYRINGE) ×3 IMPLANT

## 2016-07-28 NOTE — Anesthesia Preprocedure Evaluation (Signed)
Anesthesia Evaluation  Patient identified by MRN, date of birth, ID band Patient awake    Reviewed: Allergy & Precautions, H&P , NPO status , Patient's Chart, lab work & pertinent test results  History of Anesthesia Complications Negative for: history of anesthetic complications  Airway Mallampati: II  TM Distance: >3 FB Neck ROM: full    Dental  (+) Chipped   Pulmonary neg pulmonary ROS, neg shortness of breath,    Pulmonary exam normal breath sounds clear to auscultation       Cardiovascular Exercise Tolerance: Good (-) angina(-) Past MI and (-) DOE negative cardio ROS Normal cardiovascular exam Rhythm:regular Rate:Normal     Neuro/Psych negative neurological ROS  negative psych ROS   GI/Hepatic negative GI ROS, Neg liver ROS, neg GERD  ,  Endo/Other  negative endocrine ROS  Renal/GU      Musculoskeletal   Abdominal   Peds  Hematology negative hematology ROS (+)   Anesthesia Other Findings Post partum  Past Medical History: No date: Medical history non-contributory  Past Surgical History: No date: NO PAST SURGERIES  BMI    Body Mass Index:  29.63 kg/m      Reproductive/Obstetrics (+) Breast feeding                              Anesthesia Physical Anesthesia Plan  ASA: II  Anesthesia Plan: General ETT   Post-op Pain Management:    Induction:   Airway Management Planned:   Additional Equipment:   Intra-op Plan:   Post-operative Plan:   Informed Consent: I have reviewed the patients History and Physical, chart, labs and discussed the procedure including the risks, benefits and alternatives for the proposed anesthesia with the patient or authorized representative who has indicated his/her understanding and acceptance.   Dental Advisory Given  Plan Discussed with: Anesthesiologist, CRNA and Surgeon  Anesthesia Plan Comments:         Anesthesia Quick  Evaluation

## 2016-07-28 NOTE — Anesthesia Postprocedure Evaluation (Signed)
Anesthesia Post Note  Patient: Shannon Keith  Procedure(s) Performed: Procedure(s) (LRB): POST PARTUM TUBAL LIGATION (N/A)  Patient location during evaluation: PACU Anesthesia Type: General Level of consciousness: awake and alert Pain management: pain level controlled Vital Signs Assessment: post-procedure vital signs reviewed and stable Respiratory status: spontaneous breathing, nonlabored ventilation, respiratory function stable and patient connected to nasal cannula oxygen Cardiovascular status: blood pressure returned to baseline and stable Postop Assessment: no signs of nausea or vomiting Anesthetic complications: no     Last Vitals:  Vitals:   07/28/16 1310 07/28/16 1400  BP: 109/72 114/76  Pulse: 70 76  Resp: 18 18  Temp: 36.7 C 36.7 C    Last Pain:  Vitals:   07/28/16 1400  TempSrc: Oral  PainSc:                  Cleda Mccreedy Piscitello

## 2016-07-28 NOTE — Anesthesia Postprocedure Evaluation (Deleted)
Anesthesia Post Note  Patient: Shannon Keith  Procedure(s) Performed: Procedure(s) (LRB): POST PARTUM TUBAL LIGATION (N/A)  Patient location during evaluation: Mother Baby Anesthesia Type: Epidural Level of consciousness: awake and alert, awake and oriented Pain management: pain level controlled Vital Signs Assessment: post-procedure vital signs reviewed and stable Respiratory status: spontaneous breathing Cardiovascular status: stable Postop Assessment: epidural receding, adequate PO intake and no backache Anesthetic complications: no     Last Vitals:  Vitals:   07/28/16 0045 07/28/16 0406  BP: 105/60 (!) 90/56  Pulse: 91 77  Resp: 20 18  Temp:  36.4 C    Last Pain:  Vitals:   07/28/16 0705  TempSrc:   PainSc: (P) 0-No pain                 Zachary George

## 2016-07-28 NOTE — Op Note (Signed)
Shannon Keith, Shannon Keith                 ACCOUNT NO.:  [id number]  MEDICAL RECORD NO.:  [id number]  LOCATION:  341A                         FACILITY:  [facility]  PHYSICIAN:  Jennell Corner, MDDATE OF BIRTH:  1980-09-17  DATE OF PROCEDURE:  07/28/2016 DATE OF DISCHARGE:                              OPERATIVE REPORT   PREOPERATIVE DIAGNOSIS:  Elective permanent sterilization.  POSTOPERATIVE DIAGNOSIS:  Elective permanent sterilization.  PROCEDURE PERFORMED:  Bilateral tubal ligation, [place].  SURGEON:  Jennell Corner, MD  ANESTHESIA:  General endotracheal anesthesia.  FIRST ASSISTANT:  Scrub tech.  INDICATIONS:  A 36 year old gravida 2, para 2, status post spontaneous vaginal delivery 1 day previously.  The patient desires permanent elective sterilization.  She reconfirms her wishes the day of the procedure.  DESCRIPTION OF PROCEDURE:  After adequate general endotracheal anesthesia, the patient was placed in dorsal supine position.  The patient's abdomen was prepped and draped in normal sterile fashion. Infraumbilical incision was made after injecting with 0.5% Marcaine. Fascia was identified and opened, and the peritoneum was then identified and opened without difficulty.  The patient's right fallopian tube was identified.  The fimbriated end was visualized.  The midportion of the fallopian tube was brought to the incision and 2 separate 0 plain gut sutures were placed on the fallopian tube and a 1.5 cm portion of fallopian tube was removed.  Good hemostasis was noted.  Similar procedure was repeated on the patient's left fallopian tube after identifying the fallopian tube in the fimbriated end.  Two separate 0 plain gut sutures were placed at the midportion of the fallopian tube and a 1.5 cm portion of fallopian tube was removed.  Good hemostasis was noted.  Fascia was closed with a running 2-0 Vicryl suture and the skin was reapproximated with interrupted 4-0 Vicryl  suture.  There were no complications.  The patient tolerated the procedure well.  INTRAOPERATIVE FLUIDS:  400 mL.  ESTIMATED BLOOD LOSS:  Minimal.  The patient did receive Toradol at the end of the case.  The patient was taken to recovery room in good condition.          ______________________________ Jennell Corner, MD     TS/MEDQ  D:  07/28/2016  T:  07/28/2016  Job:  [id number]

## 2016-07-28 NOTE — Transfer of Care (Signed)
Immediate Anesthesia Transfer of Care Note  Patient: Shannon Keith  Procedure(s) Performed: Procedure(s): POST PARTUM TUBAL LIGATION (N/A)  Patient Location: PACU  Anesthesia Type:General  Level of Consciousness: sedated  Airway & Oxygen Therapy: Patient Spontanous Breathing and Patient connected to face mask oxygen  Post-op Assessment: Report given to RN and Post -op Vital signs reviewed and stable  Post vital signs: Reviewed and stable  Last Vitals:  Vitals:   07/28/16 0953 07/28/16 1057  BP: 108/77 (!) 96/59  Pulse: 74 78  Resp: 18 16  Temp: (!) 36 C 36.3 C    Last Pain:  Vitals:   07/28/16 0953  TempSrc: Tympanic  PainSc: 0-No pain         Complications: No apparent anesthesia complications

## 2016-07-28 NOTE — Anesthesia Postprocedure Evaluation (Signed)
Anesthesia Post Note  Patient: Shannon Keith  Procedure(s) Performed: * No procedures listed *  Patient location during evaluation: Mother Baby Anesthesia Type: Epidural Level of consciousness: awake and alert Pain management: pain level controlled Vital Signs Assessment: post-procedure vital signs reviewed and stable Respiratory status: spontaneous breathing, nonlabored ventilation and respiratory function stable Cardiovascular status: stable Postop Assessment: no headache, no backache, epidural receding and no signs of nausea or vomiting Anesthetic complications: no     Last Vitals:  Vitals:   07/28/16 0045 07/28/16 0406  BP: 105/60 (!) 90/56  Pulse: 91 77  Resp: 20 18  Temp:  36.4 C    Last Pain:  Vitals:   07/28/16 0406  TempSrc: Oral  PainSc:                  Rica Mast

## 2016-07-28 NOTE — Anesthesia Procedure Notes (Signed)
Procedure Name: Intubation Date/Time: 07/28/2016 10:11 AM Performed by: Ginger Carne Pre-anesthesia Checklist: Patient identified, Emergency Drugs available, Suction available and Patient being monitored Patient Re-evaluated:Patient Re-evaluated prior to inductionOxygen Delivery Method: Circle system utilized Preoxygenation: Pre-oxygenation with 100% oxygen Intubation Type: IV induction and Rapid sequence Laryngoscope Size: Miller and 2 Grade View: Grade I Tube type: Oral Tube size: 7.0 mm Number of attempts: 1 Airway Equipment and Method: Stylet Placement Confirmation: ETT inserted through vocal cords under direct vision,  positive ETCO2 and breath sounds checked- equal and bilateral Secured at: 22 cm Tube secured with: Tape

## 2016-07-28 NOTE — Brief Op Note (Signed)
07/24/2016 - 07/28/2016  10:47 AM  PATIENT:  Shannon Keith  36 y.o. female  PRE-OPERATIVE DIAGNOSIS:  Desires permanent sterilization   POST-OPERATIVE DIAGNOSIS:  Desires permanent sterilization  PROCEDURE:  Procedure(s): POST PARTUM TUBAL LIGATION (N/A) bilateral SURGEON:  Surgeon(s) and Role:    Suzy Bouchard, MD - Primary  PHYSICIAN ASSISTANT: scrub tech   ASSISTANTS: none   ANESTHESIA:   general  EBL:  Total I/O In: 400 [I.V.:400] Out: 551 [Urine:550; Blood:1]  BLOOD ADMINISTERED:none  DRAINS: none   LOCAL MEDICATIONS USED:  MARCAINE    and Amount: 2 ml  SPECIMEN:  Source of Specimen:  portion right and left tube  DISPOSITION OF SPECIMEN:  PATHOLOGY  COUNTS:  YES  TOURNIQUET:  * No tourniquets in log *  DICTATION: .Other Dictation: Dictation Number verbal  PLAN OF CARE: Admit to inpatient   PATIENT DISPOSITION:  PACU - hemodynamically stable.   Delay start of Pharmacological VTE agent (>24hrs) due to surgical blood loss or risk of bleeding: not applicable

## 2016-07-28 NOTE — Progress Notes (Signed)
Post Partum Day 1 Subjective: no complaints Scheduled for pp BTL today .  Objective: Blood pressure 97/60, pulse 80, temperature 97.4 F (36.3 C), temperature source Oral, resp. rate 20, height  (1.575 m), weight 73.5 kg (162 lb), SpO2 99 %, unknown if currently breastfeeding.  Physical Exam:  General: alert and cooperative Lochia: appropriate Uterine Fundus: firm Incision: n/a DVT Evaluation: No evidence of DVT seen on physical exam.   Recent Labs  07/28/16 0616  HGB 11.1*  HCT 33.2*    Assessment/Plan: PP BTL  Pt reconfirms wishes despite Baby with some transient respiratory issues  Risks of the procedure discussed with the pt . NPO   LOS: 4 days   Ihor Austin Xabi Wittler 07/28/2016, 9:01 AM

## 2016-07-28 NOTE — Anesthesia Post-op Follow-up Note (Cosign Needed)
Anesthesia QCDR form completed.        

## 2016-07-29 LAB — CBC
HCT: 30.9 % — ABNORMAL LOW (ref 35.0–47.0)
HEMOGLOBIN: 10.5 g/dL — AB (ref 12.0–16.0)
MCH: 28.9 pg (ref 26.0–34.0)
MCHC: 33.9 g/dL (ref 32.0–36.0)
MCV: 85.4 fL (ref 80.0–100.0)
Platelets: 178 10*3/uL (ref 150–440)
RBC: 3.62 MIL/uL — ABNORMAL LOW (ref 3.80–5.20)
RDW: 14.3 % (ref 11.5–14.5)
WBC: 14.6 10*3/uL — ABNORMAL HIGH (ref 3.6–11.0)

## 2016-07-29 LAB — SURGICAL PATHOLOGY

## 2016-07-29 MED ORDER — ASCORBIC ACID 250 MG PO TABS: 250.0000 mg | ORAL_TABLET | Freq: Two times a day (BID) | ORAL | 3 refills | Status: AC

## 2016-07-29 MED ORDER — DOCUSATE SODIUM 100 MG PO CAPS: 100.0000 mg | ORAL_CAPSULE | Freq: Every day | ORAL | 3 refills | Status: AC | PRN

## 2016-07-29 MED ORDER — OXYCODONE-ACETAMINOPHEN 5-325 MG PO TABS: 1.0000 | ORAL_TABLET | Freq: Four times a day (QID) | ORAL | 0 refills | Status: AC | PRN

## 2016-07-29 MED ORDER — IBUPROFEN 800 MG PO TABS: 800.0000 mg | ORAL_TABLET | Freq: Three times a day (TID) | ORAL | 0 refills | Status: AC | PRN

## 2016-07-29 MED ORDER — ONDANSETRON 4 MG PO TBDP: 4.0000 mg | ORAL_TABLET | Freq: Three times a day (TID) | ORAL | 0 refills | Status: AC | PRN

## 2016-07-29 MED ORDER — FERROUS SULFATE 325 (65 FE) MG PO TABS: 325.0000 mg | ORAL_TABLET | Freq: Two times a day (BID) | ORAL | 3 refills | Status: AC

## 2016-07-29 NOTE — Lactation Note (Signed)
This note was copied from a baby's chart. Lactation Consultation Note  Patient Name: Boy Deborah Lazcano [id number] Date: 07/29/2016 Reason for consult: Initial assessment   Maternal Data    Feeding Feeding Type: Bottle Fed - Formula Nipple Type: Slow - flow Length of feed: 10 min  LATCH Score/Interventions Latch: Repeated attempts needed to sustain latch, nipple held in mouth throughout feeding, stimulation needed to elicit sucking reflex.  Audible Swallowing: A few with stimulation Intervention(s): Skin to skin;Hand expression  Type of Nipple: Everted at rest and after stimulation  Comfort (Breast/Nipple): Soft / non-tender     Hold (Positioning): Assistance needed to correctly position infant at breast and maintain latch. Intervention(s): Breastfeeding basics reviewed;Support Pillows;Position options  LATCH Score: 7  Lactation Tools Discussed/Used   The following plan has been given to the patient. Mother is to offer the breasts and complete the feeding by supplementing with pumped milk or formula at each feeding. This should happen every three hours or sooner if the baby is cuing sooner.  The parents can get a pump through their insurance and are to supplement with formula until it arrives.  They can follow up with lactation here or at the pediatricians office.   Consult Status Consult Status: Complete    Trudee Grip 07/29/2016, 1:02 PM

## 2016-07-29 NOTE — Progress Notes (Signed)
Patient discharged home with significant other and infant who was discharged from special care nursery. Discharge instructions, prescriptions and follow up appointment given to and reviewed with patient. Patient verbalized understanding. Escorted out via wheelchair by Guido Sander, NT.

## 2019-04-05 IMAGING — US US OB LIMITED
1 series · 14 of 21 positions shown · non-contrast
Comparison: none

CLINICAL DATA: Pre induction, confirm fetal presentation and as
well as amniotic fluid

EXAM:
LIMITED OBSTETRIC ULTRASOUND

[Series 1: us ob limited · 0.28mm/px · 14 of 21 slices shown]
[im 1/21]
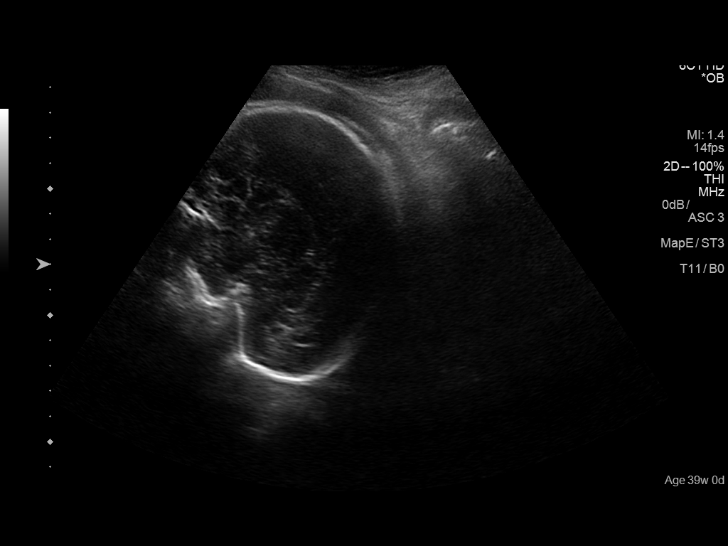
[im 3/21]
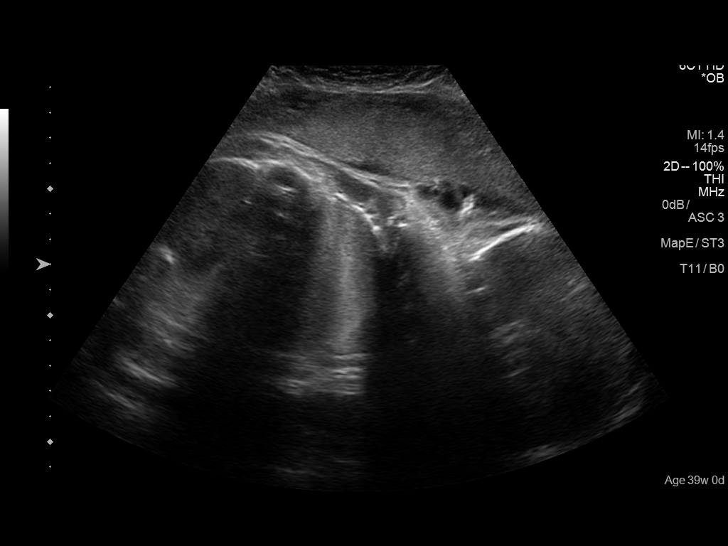
[im 4/21]
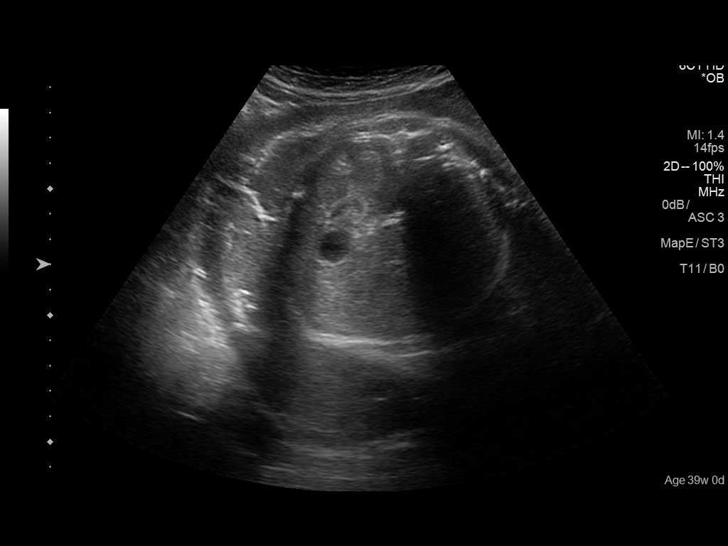
[im 6/21]
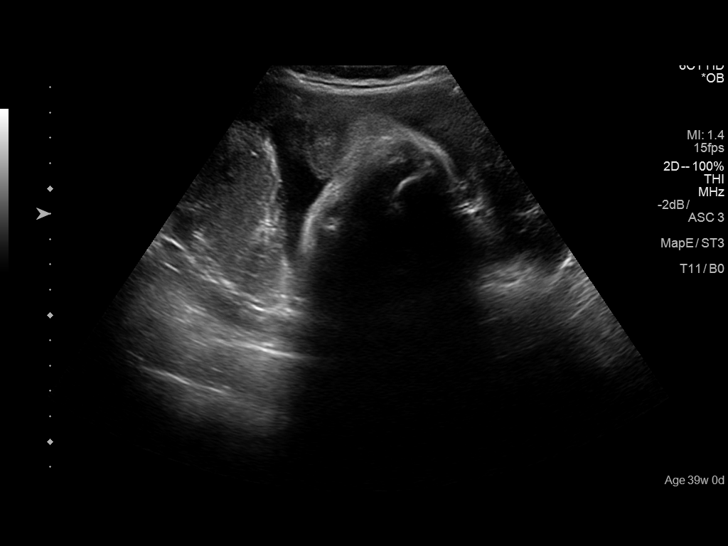
[im 7/21]
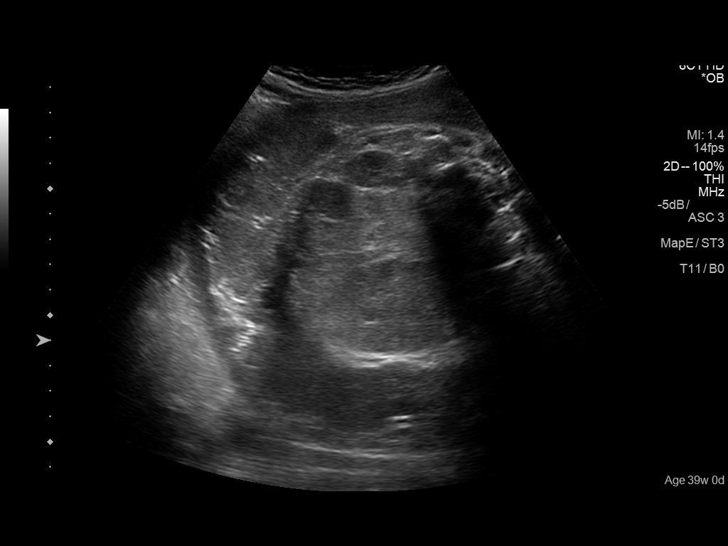
[im 9/21]
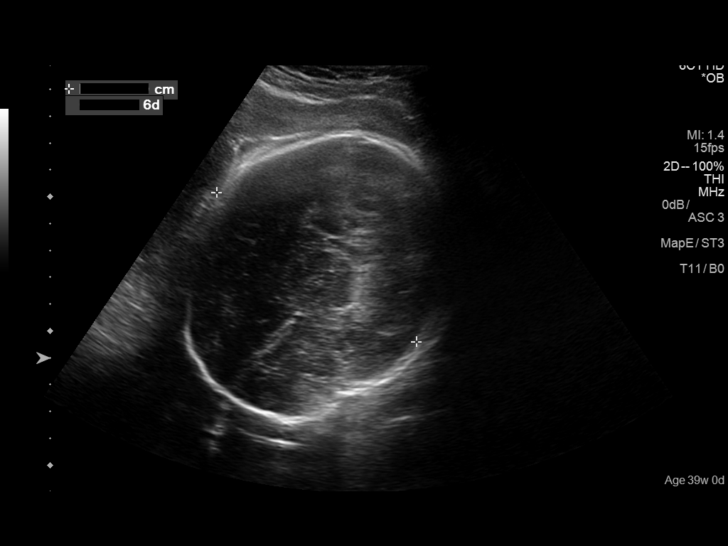
[im 10/21]
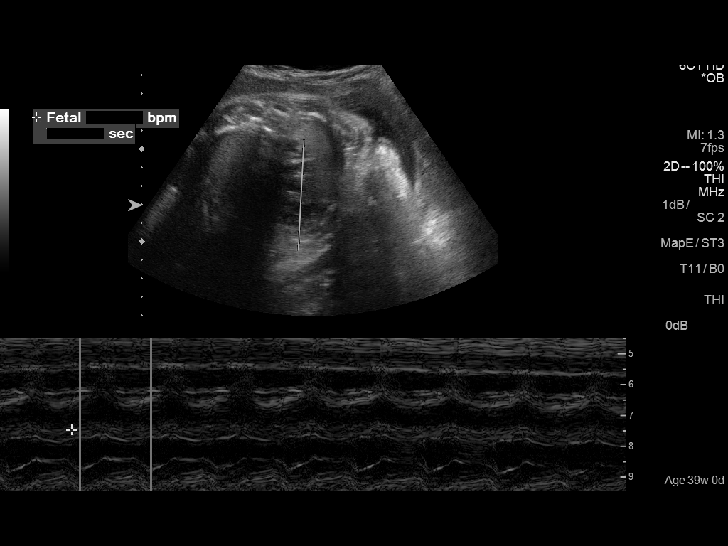
[im 12/21]
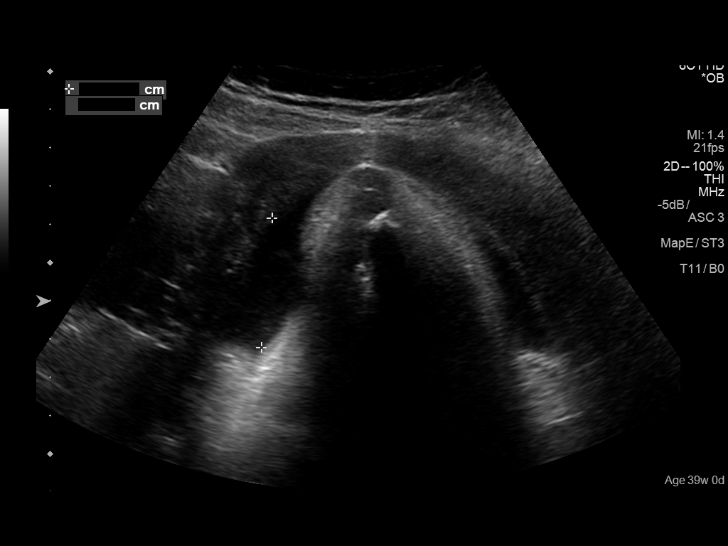
[im 13/21]
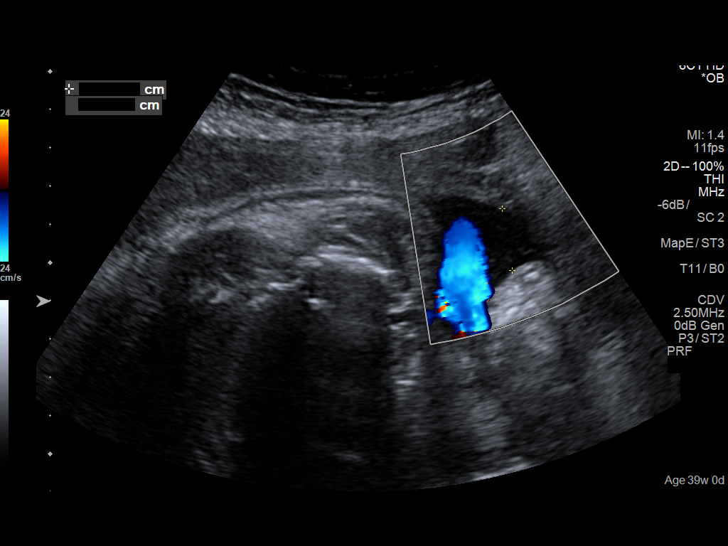
[im 15/21]
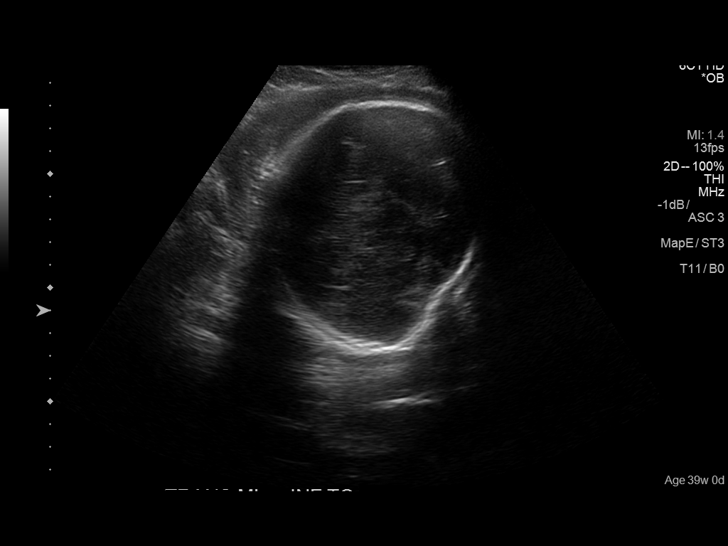
[im 16/21]
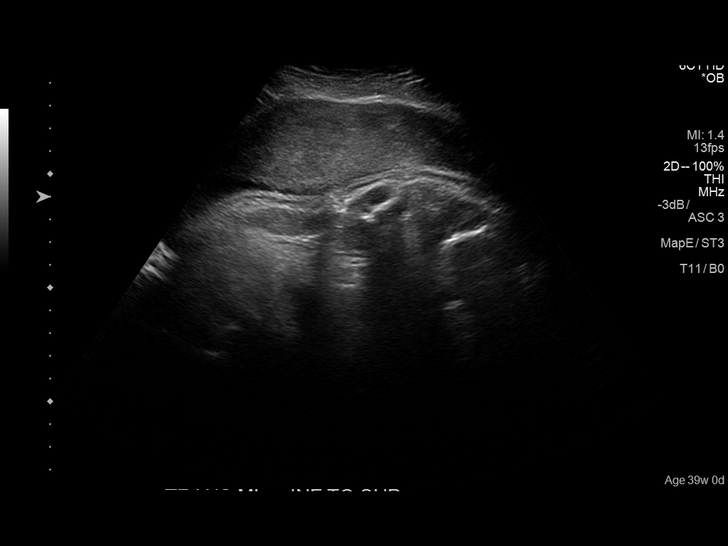
[im 18/21]
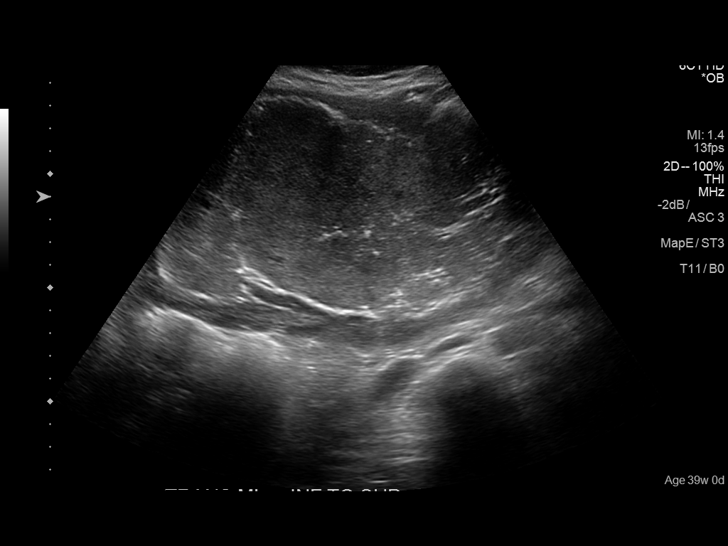
[im 19/21]
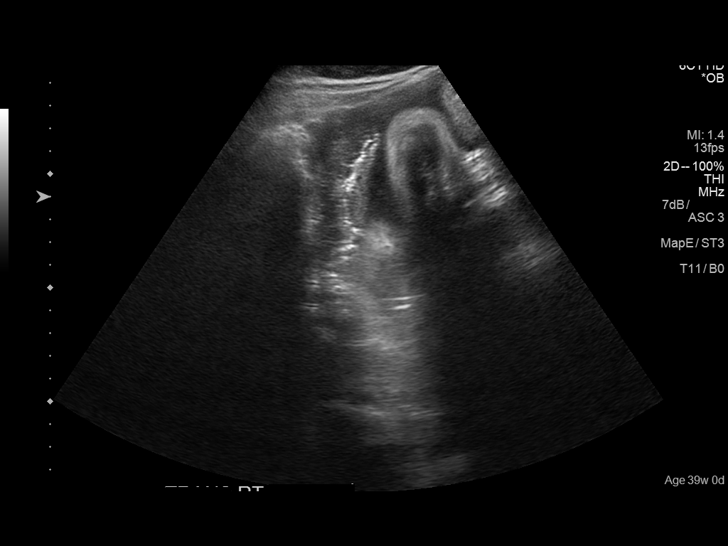
[im 21/21]
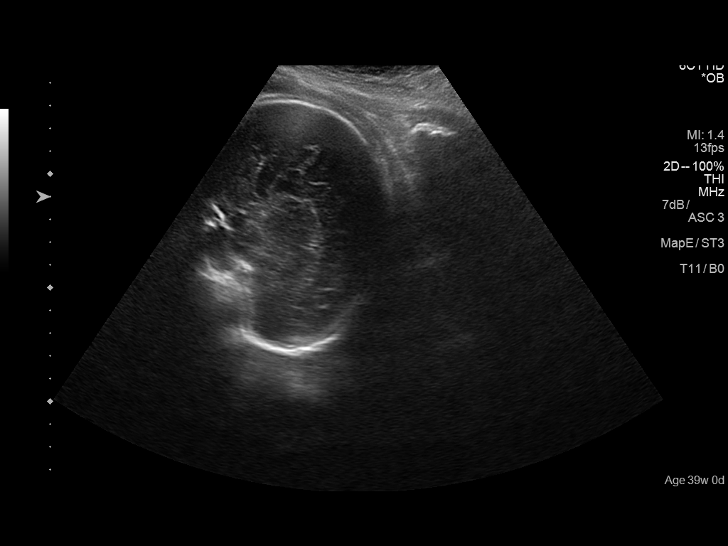

[14 of 21 positions shown; findings below may reference images not displayed]

FINDINGS: Number of Fetuses: 1

Heart Rate:  144 bpm

Movement: Yes

Presentation: Cephalic

Placental Location: Fundal

Previa: No

Amniotic Fluid (Subjective): AFI approximately 9.1 cm with single
deepest pocket of 3.3 cm

BPD:  9.3cm 37w  6d

MATERNAL FINDINGS:

Cervix:  Not identified

Uterus/Adnexae: Neither ovary was visualized due to the gravid
uterus.
IMPRESSION: 37 week 6 day gestation in cephalic presentation with fundal
placental location. No previa.

AFI of approximately 9.1 cm with single deepest pocket 3.3 cm.

This exam is performed on an emergent basis and does not
comprehensively evaluate fetal size, dating, or anatomy; follow-up
complete OB US should be considered if further fetal assessment is
warranted.

## 2020-11-10 ENCOUNTER — Other Ambulatory Visit: Payer: Self-pay | Admitting: Certified Nurse Midwife

## 2020-11-10 DIAGNOSIS — Z1231 Encounter for screening mammogram for malignant neoplasm of breast: Secondary | ICD-10-CM

## 2021-11-06 ENCOUNTER — Other Ambulatory Visit: Payer: Self-pay | Admitting: Internal Medicine

## 2021-11-06 DIAGNOSIS — Z1231 Encounter for screening mammogram for malignant neoplasm of breast: Secondary | ICD-10-CM

## 2021-12-01 ENCOUNTER — Ambulatory Visit
Admission: RE | Admit: 2021-12-01 | Discharge: 2021-12-01 | Disposition: A | Discharge status: Home / Self Care | Payer: 59 | Source: Ambulatory Visit | Attending: Internal Medicine | Admitting: Internal Medicine

## 2021-12-01 DIAGNOSIS — Z1231 Encounter for screening mammogram for malignant neoplasm of breast: Secondary | ICD-10-CM | POA: Diagnosis present

## 2021-12-23 ENCOUNTER — Other Ambulatory Visit (INDEPENDENT_AMBULATORY_CARE_PROVIDER_SITE_OTHER): Payer: Self-pay | Admitting: Nurse Practitioner

## 2021-12-23 DIAGNOSIS — I8311 Varicose veins of right lower extremity with inflammation: Secondary | ICD-10-CM

## 2021-12-28 ENCOUNTER — Ambulatory Visit (INDEPENDENT_AMBULATORY_CARE_PROVIDER_SITE_OTHER): Payer: 59

## 2021-12-28 ENCOUNTER — Encounter (INDEPENDENT_AMBULATORY_CARE_PROVIDER_SITE_OTHER): Payer: Self-pay | Admitting: Vascular Surgery

## 2021-12-28 ENCOUNTER — Ambulatory Visit (INDEPENDENT_AMBULATORY_CARE_PROVIDER_SITE_OTHER): Payer: 59 | Admitting: Vascular Surgery

## 2021-12-28 DIAGNOSIS — I83819 Varicose veins of unspecified lower extremities with pain: Secondary | ICD-10-CM | POA: Insufficient documentation

## 2021-12-28 DIAGNOSIS — I8311 Varicose veins of right lower extremity with inflammation: Secondary | ICD-10-CM | POA: Diagnosis not present

## 2021-12-28 DIAGNOSIS — I872 Venous insufficiency (chronic) (peripheral): Secondary | ICD-10-CM | POA: Diagnosis not present

## 2021-12-28 NOTE — Progress Notes (Signed)
MRN : [id number]  Shannon Keith is a 41 y.o. ([birth year]) female who presents with chief complaint of legs hurt and swell.  History of Present Illness: ***  No outpatient medications have been marked as taking for the 12/28/21 encounter (Appointment) with Delana Meyer, Dolores Lory, MD.    Past Medical History:  Diagnosis Date   Medical history non-contributory     Past Surgical History:  Procedure Laterality Date   NO PAST SURGERIES     TUBAL LIGATION N/A 07/28/2016   Procedure: POST PARTUM TUBAL LIGATION;  Surgeon: Boykin Nearing, MD;  Location: [facility];  Service: Gynecology;  Laterality: N/A;    Social History Social History   Tobacco Use   Smoking status: Never   Smokeless tobacco: Never  Substance Use Topics   Alcohol use: No   Drug use: No    Family History No family history on file.  No Known Allergies   REVIEW OF SYSTEMS (Negative unless checked)  Constitutional: [] Weight loss  [] Fever  [] Chills Cardiac: [] Chest pain   [] Chest pressure   [] Palpitations   [] Shortness of breath when laying flat   [] Shortness of breath with exertion. Vascular:  [] Pain in legs with walking   [x] Pain in legs at rest  [] History of DVT   [] Phlebitis   [x] Swelling in legs   [] Varicose veins   [] Non-healing ulcers Pulmonary:   [] Uses home oxygen   [] Productive cough   [] Hemoptysis   [] Wheeze  [] COPD   [] Asthma Neurologic:  [] Dizziness   [] Seizures   [] History of stroke   [] History of TIA  [] Aphasia   [] Vissual changes   [] Weakness or numbness in arm   [] Weakness or numbness in leg Musculoskeletal:   [] Joint swelling   [] Joint pain   [] Low back pain Hematologic:  [] Easy bruising  [] Easy bleeding   [] Hypercoagulable state   [] Anemic Gastrointestinal:  [] Diarrhea   [] Vomiting  [] Gastroesophageal reflux/heartburn   [] Difficulty swallowing. Genitourinary:  [] Chronic kidney disease   [] Difficult urination  [] Frequent urination   [] Blood in urine Skin:  [] Rashes   [] Ulcers   Psychological:  [] History of anxiety   []  History of major depression.  Physical Examination  There were no vitals filed for this visit. There is no height or weight on file to calculate BMI. Gen: WD/WN, NAD Head: [place]/AT, No temporalis wasting.  Ear/Nose/Throat: Hearing grossly intact, nares w/o erythema or drainage, pinna without lesions Eyes: PER, EOMI, sclera nonicteric.  Neck: Supple, no gross masses.  No JVD.  Pulmonary:  Good air movement, no audible wheezing, no use of accessory muscles.  Cardiac: RRR, precordium not hyperdynamic. Vascular:  scattered varicosities present bilaterally.  Moderate venous stasis changes to the legs bilaterally.  2+ soft pitting edema  Vessel Right Left  Radial Palpable Palpable  Gastrointestinal: soft, non-distended. No guarding/no peritoneal signs.  Musculoskeletal: M/S 5/5 throughout.  No deformity.  Neurologic: CN 2-12 intact. Pain and light touch intact in extremities.  Symmetrical.  Speech is fluent. Motor exam as listed above. Psychiatric: Judgment intact, Mood & affect appropriate for pt's clinical situation. Dermatologic: Venous rashes no ulcers noted.  No changes consistent with cellulitis. Lymph : No lichenification or skin changes of chronic lymphedema.  CBC Lab Results  Component Value Date   WBC 14.6 (H) 07/29/2016   HGB 10.5 (L) 07/29/2016   HCT 30.9 (L) 07/29/2016   MCV 85.4 07/29/2016   PLT 178 07/29/2016    BMET No results found for: "NA", "  K", "CL", "CO2", "GLUCOSE", "BUN", "CREATININE", "CALCIUM", "GFRNONAA", "GFRAA" CrCl cannot be calculated (No successful lab value found.).  COAG No results found for: "INR", "PROTIME"  Radiology MM 3D SCREEN BREAST BILATERAL  Result Date: 12/02/2021 CLINICAL DATA:  Screening. EXAM: DIGITAL SCREENING BILATERAL MAMMOGRAM WITH TOMOSYNTHESIS AND CAD TECHNIQUE: Bilateral screening digital craniocaudal and mediolateral oblique mammograms were obtained. Bilateral screening digital breast  tomosynthesis was performed. The images were evaluated with computer-aided detection. COMPARISON:  None available. ACR Breast Density Category b: There are scattered areas of fibroglandular density. FINDINGS: There are no findings suspicious for malignancy. IMPRESSION: No mammographic evidence of malignancy. A result letter of this screening mammogram will be mailed directly to the patient. RECOMMENDATION: Screening mammogram in one year. (Code:SM-B-01Y) BI-RADS CATEGORY  1: Negative. Electronically Signed   By: Edwin Cap M.D.   On: 12/02/2021 12:29     Assessment/Plan There are no diagnoses linked to this encounter.   Levora Dredge, MD  12/28/2021 8:26 AM

## 2021-12-30 ENCOUNTER — Encounter (INDEPENDENT_AMBULATORY_CARE_PROVIDER_SITE_OTHER): Payer: Self-pay | Admitting: Vascular Surgery

## 2022-02-08 ENCOUNTER — Encounter (INDEPENDENT_AMBULATORY_CARE_PROVIDER_SITE_OTHER): Payer: Self-pay

## 2023-03-17 ENCOUNTER — Other Ambulatory Visit: Payer: Self-pay | Admitting: Internal Medicine

## 2023-03-17 DIAGNOSIS — Z1231 Encounter for screening mammogram for malignant neoplasm of breast: Secondary | ICD-10-CM
